# Patient Record
Sex: Female | Born: 1965 | Race: White | Hispanic: No | Marital: Married | Smoking: Never smoker
Health system: Southern US, Community
[De-identification: ages and names within clinical notes are randomized; demographics above are authoritative.]

## PROBLEM LIST (undated history)

## (undated) DIAGNOSIS — L309 Dermatitis, unspecified: Secondary | ICD-10-CM

## (undated) DIAGNOSIS — N200 Calculus of kidney: Secondary | ICD-10-CM

## (undated) DIAGNOSIS — F329 Major depressive disorder, single episode, unspecified: Secondary | ICD-10-CM

## (undated) DIAGNOSIS — Z8632 Personal history of gestational diabetes: Secondary | ICD-10-CM

## (undated) DIAGNOSIS — R351 Nocturia: Secondary | ICD-10-CM

## (undated) DIAGNOSIS — N1330 Unspecified hydronephrosis: Secondary | ICD-10-CM

## (undated) DIAGNOSIS — Z87442 Personal history of urinary calculi: Secondary | ICD-10-CM

## (undated) DIAGNOSIS — F32A Depression, unspecified: Secondary | ICD-10-CM

## (undated) DIAGNOSIS — D509 Iron deficiency anemia, unspecified: Secondary | ICD-10-CM

## (undated) DIAGNOSIS — N201 Calculus of ureter: Secondary | ICD-10-CM

## (undated) DIAGNOSIS — Z8759 Personal history of other complications of pregnancy, childbirth and the puerperium: Secondary | ICD-10-CM

## (undated) HISTORY — DX: Dermatitis, unspecified: L30.9

---

## 2000-01-30 ENCOUNTER — Other Ambulatory Visit: Admission: RE | Admit: 2000-01-30 | Discharge: 2000-01-30 | Payer: Self-pay | Admitting: Gynecology

## 2000-06-01 ENCOUNTER — Encounter: Admission: RE | Admit: 2000-06-01 | Discharge: 2000-08-30 | Payer: Self-pay | Admitting: Gynecology

## 2000-06-13 ENCOUNTER — Other Ambulatory Visit: Admission: RE | Admit: 2000-06-13 | Discharge: 2000-06-13 | Payer: Self-pay | Admitting: Gynecology

## 2000-08-08 ENCOUNTER — Inpatient Hospital Stay (HOSPITAL_COMMUNITY): Admission: AD | Admit: 2000-08-08 | Discharge: 2000-08-11 | Payer: Self-pay | Admitting: Internal Medicine

## 2000-09-17 ENCOUNTER — Other Ambulatory Visit: Admission: RE | Admit: 2000-09-17 | Discharge: 2000-09-17 | Payer: Self-pay | Admitting: *Deleted

## 2004-04-20 ENCOUNTER — Ambulatory Visit (HOSPITAL_COMMUNITY): Admission: RE | Admit: 2004-04-20 | Discharge: 2004-04-20 | Payer: Self-pay | Admitting: Pulmonary Disease

## 2004-09-25 ENCOUNTER — Emergency Department (HOSPITAL_COMMUNITY): Admission: EM | Admit: 2004-09-25 | Discharge: 2004-09-25 | Payer: Self-pay | Admitting: Emergency Medicine

## 2004-11-09 ENCOUNTER — Emergency Department (HOSPITAL_COMMUNITY): Admission: EM | Admit: 2004-11-09 | Discharge: 2004-11-09 | Payer: Self-pay | Admitting: Emergency Medicine

## 2006-06-18 ENCOUNTER — Ambulatory Visit (HOSPITAL_COMMUNITY): Admission: AD | Admit: 2006-06-18 | Discharge: 2006-06-18 | Payer: Self-pay | Admitting: Gynecology

## 2006-06-18 ENCOUNTER — Encounter (INDEPENDENT_AMBULATORY_CARE_PROVIDER_SITE_OTHER): Payer: Self-pay | Admitting: Specialist

## 2006-06-18 HISTORY — PX: LAPAROSCOPY FOR ECTOPIC PREGNANCY: SUR765

## 2006-07-19 ENCOUNTER — Other Ambulatory Visit: Admission: RE | Admit: 2006-07-19 | Discharge: 2006-07-19 | Payer: Self-pay | Admitting: Gynecology

## 2006-11-13 HISTORY — PX: ACOUSTIC NEUROMA RESECTION: SHX5713

## 2006-11-29 ENCOUNTER — Ambulatory Visit: Payer: Self-pay | Admitting: Pulmonary Disease

## 2006-12-02 ENCOUNTER — Encounter: Admission: RE | Admit: 2006-12-02 | Discharge: 2006-12-02 | Payer: Self-pay | Admitting: Otolaryngology

## 2007-09-26 ENCOUNTER — Other Ambulatory Visit: Admission: RE | Admit: 2007-09-26 | Discharge: 2007-09-26 | Payer: Self-pay | Admitting: Gynecology

## 2008-06-23 ENCOUNTER — Encounter: Payer: Self-pay | Admitting: Pulmonary Disease

## 2008-07-10 ENCOUNTER — Ambulatory Visit (HOSPITAL_COMMUNITY): Admission: RE | Admit: 2008-07-10 | Discharge: 2008-07-10 | Payer: Self-pay | Admitting: Gynecology

## 2008-08-08 ENCOUNTER — Ambulatory Visit: Payer: Self-pay | Admitting: Gynecology

## 2008-09-09 ENCOUNTER — Ambulatory Visit: Payer: Self-pay | Admitting: Gynecology

## 2008-10-11 ENCOUNTER — Ambulatory Visit: Payer: Self-pay | Admitting: Gynecology

## 2008-10-26 ENCOUNTER — Ambulatory Visit: Payer: Self-pay | Admitting: Gynecology

## 2008-10-30 ENCOUNTER — Ambulatory Visit: Payer: Self-pay | Admitting: Gynecology

## 2008-11-03 ENCOUNTER — Ambulatory Visit: Payer: Self-pay | Admitting: Gynecology

## 2008-11-11 ENCOUNTER — Ambulatory Visit: Payer: Self-pay | Admitting: Gynecology

## 2008-11-26 ENCOUNTER — Ambulatory Visit: Payer: Self-pay | Admitting: Gynecology

## 2009-06-28 ENCOUNTER — Inpatient Hospital Stay (HOSPITAL_COMMUNITY): Admission: AD | Admit: 2009-06-28 | Discharge: 2009-06-30 | Payer: Self-pay | Admitting: Obstetrics and Gynecology

## 2009-06-29 ENCOUNTER — Encounter (INDEPENDENT_AMBULATORY_CARE_PROVIDER_SITE_OTHER): Payer: Self-pay | Admitting: Obstetrics and Gynecology

## 2009-06-29 HISTORY — PX: OTHER SURGICAL HISTORY: SHX169

## 2009-09-14 ENCOUNTER — Encounter: Payer: Self-pay | Admitting: Gynecology

## 2009-09-14 ENCOUNTER — Ambulatory Visit: Payer: Self-pay | Admitting: Gynecology

## 2009-09-14 ENCOUNTER — Other Ambulatory Visit: Admission: RE | Admit: 2009-09-14 | Discharge: 2009-09-14 | Payer: Self-pay | Admitting: Gynecology

## 2009-10-13 ENCOUNTER — Encounter: Payer: Self-pay | Admitting: Pulmonary Disease

## 2010-11-10 ENCOUNTER — Other Ambulatory Visit
Admission: RE | Admit: 2010-11-10 | Discharge: 2010-11-10 | Payer: Self-pay | Source: Home / Self Care | Admitting: Gynecology

## 2010-11-10 ENCOUNTER — Ambulatory Visit
Admission: RE | Admit: 2010-11-10 | Discharge: 2010-11-10 | Payer: Self-pay | Source: Home / Self Care | Attending: Gynecology | Admitting: Gynecology

## 2011-02-18 LAB — CBC
Hemoglobin: 8.3 g/dL — ABNORMAL LOW (ref 12.0–15.0)
MCHC: 33.8 g/dL (ref 30.0–36.0)
Platelets: 263 10*3/uL (ref 150–400)
Platelets: 347 10*3/uL (ref 150–400)
RDW: 14 % (ref 11.5–15.5)

## 2011-03-28 NOTE — Op Note (Signed)
[name], Sarah Watson NO.:  [id number]   MEDICAL RECORD NO.:  [id number]         PATIENT TYPE:  WINP   LOCATION:                                FACILITY:  [facility]   PHYSICIAN:  Malva Limes, M.D.    DATE OF BIRTH:  11-03-1966   DATE OF PROCEDURE:  06/29/2009  DATE OF DISCHARGE:                               OPERATIVE REPORT   PREOPERATIVE DIAGNOSIS:  The patient desires permanent sterilization.   POSTOPERATIVE DIAGNOSIS:  The patient desires permanent sterilization.   PROCEDURE:  Left partial salpingectomy   SURGEON:  Malva Limes, MD   ANESTHESIA:  Epidural.   ANTIBIOTICS:  Ancef 1 g.   DRAINS:  Red rubber catheter to bladder.   ESTIMATED BLOOD LOSS:  Minimal.   SPECIMENS:  Portion of left fallopian tube sent to Pathology.   COMPLICATIONS:  None.   PROCEDURE:  The patient was taken to the operating room where her  epidural anesthetic was reinjected.  Once an adequate level was reached,  the patient was placed in dorsal supine position.  She was prepped and  draped in usual fashion for this procedure.  Her bladder was drained  with a red rubber catheter.  Umbilicus was injected with 0.25% Marcaine  tube. A 2-cm infraumbilical incision was made.  This incision was  carried down to the fascia.  The fascia was grasped with 2 Kochers and  entered with blade.  The parietal peritoneum was identified, grasped  with hemostats, and entered with Metzenbaum scissors.  The patient was  then placed in Trendelenburg.  Army-[employer]y retractors were placed.  The  omentum was moved anteriorly.  The left fallopian tube was then grasped  with a Babcock and carried to the fimbriated end for identification.  Once this was accomplished, 2-cm knuckle in the isthmic portion was  doubly ligated with 0 plain gut suture.  The knuckle was excised.  Both  ostia were visualized.  Hemostasis appeared to be adequate.  The tube  was released.  The right adnexa was inspected.  Ovary  appeared to be  normal.  There was no evidence of a tube on the right.  At this point,  the parietal peritoneum and rectus muscles  were reapproximated using 0 Monocryl suture in a running fashion.  The  fascia was closed using 3-0 Vicryl in a subcuticular fashion.  The  patient tolerated the procedure well.  She was taken to recovery room in  stable condition.  Instrument and lap counts were correct x2.           ______________________________  Malva Limes, M.D.     MA/MEDQ  D:  06/29/2009  T:  06/29/2009  Job:  [id number]

## 2011-03-31 NOTE — H&P (Signed)
NAMEKEERA, ALTIDOR                 ACCOUNT NO.:  [id number]   MEDICAL RECORD NO.:  [id number]          PATIENT TYPE:  AMB   LOCATION:                                [facility]ILITY:  [facility]   PHYSICIAN:  Juan H. Lily Peer, M.D.DATE OF BIRTH:  06/18/1985   DATE OF ADMISSION:  DATE OF DISCHARGE:                                HISTORY & PHYSICAL   CHIEF COMPLAINT:  Right ectopic pregnancy.   HISTORY:  The patient is a 45 year old gravida 2, para 1, who was seen in  the office on July 30th.  She had not been seen in the office for 5 years  prior to that.  She had not been sure of her last menstrual period.  She  stated it was some time in the month of June, which on that visit, July  30th, would have placed her 4-1/[redacted] weeks gestation.  She was asked to return  back to the office for an ultrasound for confirmation of viability as well  as for correct dates and the ultrasound today demonstrated a right viable  ectopic pregnancy, noted mass measuring 24 x 17 mm with fetal heart rate at  141 b.p.m., right ovary thin wall cystic mass measuring 23 x 18 mm, and a  thick wall mass 17 x 17 mm.  The ectopic was located superior to the right  ovary and the right adnexa adjacent to the lateral wall of the uterus, the  left ovary was normal.  The endometrial cavity thick, tri-layered, with no  intrauterine pregnancy seen.  The patient did not appear to be in any acute  distress, but on examination was tender in her right lower quadrant, no  unusual vaginal bleeding was reported.  The patient's last meal was a very  light snack at 11 a.m. today, approximately 6 hours ago.   PAST MEDICAL HISTORY:  1. THE PATIENT IS ALLERGIC TO PENICILLIN.  2. She has had one normal spontaneous vaginal delivery.  3. She is currently on prenatal vitamins.   PHYSICAL EXAMINATION:  Blood pressure 120/70.  Patient in no acute distress.  HEENT:  Unremarkable.  NECK:  Supple, trachea midline.  No carotid bruits, no thyromegaly.  LUNGS:  Clear to auscultation without rhonchi or wheezes.  HEART:  Regular rate and rhythm.  No murmurs or gallop.  BREAST EXAM:  Not done.  ABDOMEN:  Soft.  Tenderness in the right lower quadrant but no rebound or  guarding.  Negative Rovsing, negative obturator sign.  PELVIC EXAMINATION:  Was not done due to the fact that the ultrasound had  been done prior to the visit to the examining room, and based on these  findings we decided to proceed with laparoscopic evaluation.   ASSESSMENT:  A 45 year old gravida 1, para 1, with a right ectopic  pregnancy, cardiac activity noted, nonruptured.  Patient stable.  Will be  sent to [facility] for emergency laparoscopic attention to the right  ectopic pregnancy.  Patient was counseled of the possible scenarios such as  salpingostomy versus salpingectomy versus right salpingo-oophorectomy.  Also, in the event of technical difficulty  and unable to complete the  operation laparoscopically, we may need to proceed with an open laparotomy  technique which may require the patient to stay in the hospital additional  days.  The risks of infection, although she will receive prophylaxis  antibiotic, were discussed.  The risks of internal hemorrhage were discussed  with the patient and in the event of blood transfusion potential risk of  anaphylactic reactions, hepatitis and acquired immunodeficiency syndrome  were discussed as well.  All of these questions were answered and will  follow accordingly.  Also, the risks of deep vein thrombosis and  pulmonary embolism were discussed as well, as well as complications from  anesthesia will be discussed separately.  All questions were answered and  will follow accordingly.   PLAN:  As per assessment above.      Juan H. Lily Peer, M.D.  Electronically Signed     JHF/MEDQ  D:  06/18/2006  T:  06/18/2006  Job:  [id number]

## 2011-03-31 NOTE — H&P (Signed)
[facility]  Patient:    Sarah Watson, Sarah Watson                          MRN: [id number] Adm. Date:  08/08/00 Attending:  Gaetano Hawthorne. Lily Peer, M.D.                         History and Physical  CHIEF COMPLAINT: 1. Nonreassuring fetal heart rate tracing. 2. Gestational diabetes.  HISTORY OF PRESENT ILLNESS:  The patient is a 45 year old gravida 2, para 0, AB 1 with a date of confinement of August 19, 2000.  The patient currently 38-1/2 weeks estimated gestational age with known gestational diabetes, diet controlled, was seen in the office today for followup NST and the NST showed evidence of repetitive small late decelerations in the first half of the monitoring.  In the second half the fetal heart tracing was reassuring nevertheless the patient was instructed to have a biophysical profile whereby the biophysical profile demonstrated a score of 6/8, two were taken off for breathing movements.  If we add the entire composite score to include NST her biophysical profile would be 6/10.  There was a questionable nuchal cord, it is female infant in the vertex presentation, an incidental finding that was not seen on previous ultrasound scan and demonstrated a 6 x 4 mm echogenic focus just beneath the right atrium.  Difficult to analyze due to the fetal position and age.  Possibly beneath the diaphragm but the assessment was extremely limited.  The AFI was 13.9 at 50% for the [redacted] weeks gestation.  The patients prenatal course besides being followed for her blood sugars had an atypical squamous cells on Pap smear and also had to repeat her alpha fetoprotein secondary to discrepancy in the dates.  Followup AFP at the appropriate time was reported to be normal.  Also, patient had a low lying placenta early in the scan.  Followup ultrasounds demonstrated completed resolution of low lying placenta.  PAST MEDICAL HISTORY:  She had a D&C in 1986, at [redacted] weeks  gestation.  ALLERGIES: She is allergic to PENICILLIN.  REVIEW OF SYSTEMS:  See hospital form.  PHYSICAL EXAMINATION:  GENERAL:  Well-developed, well-nourished female.  HEENT:  Unremarkable.  NECK:  Supple. Trachea midline.  No carotid bruits, no thyromegaly.  LUNGS:  Clear to auscultation without rhonchis or wheezes.  HEART:  Regular rate and rhythm, no murmurs or gallops.  BREAST:  Examination was done during the first trimester and reported to be normal.  ABDOMEN:   Gravid uterus.  Vertex presentation by [facility] maneuver.  Confirmed by ultrasound today.  Positive fetal heart tones.  PELVIC EXAM:  Cervix finger tip 50% effaced, -3 station.  EXTREMITIES:  DTR 1+, negative clonus.  PRENATAL LABORATORY DATA:  Blood type B+, negative antibody screen, sickle cell trait was negative.  VDRL was nonreactive.  Rubella without evidence of immunity.  Hepatitis B surface antigen and HIV were negative. GC and chlamydia cultures were negative.  Alpha-fetoprotein as discussed above.  CBS culture was negative.  Blood sugar screen abnormal as was 3 hour GTT.  ASSESSMENT:  A 45 year old gravida 2, para 0, AB 1, at 38-1/2 weeks estimated gestational age with nonreassuring fetal heart rate tracing and a biophysical profile of 6/10.  Will be admitted for induction.  She will receive cervidil for cervical ripening and then follow with Pitocin IV in the event of a persistence of nonreassuring  fetal heart rate tracing.  The patient is aware that she could subsequently result in C-section as a mode of delivery.  All questions were answered and we will monitor closely.  PLAN:  As per assessment above. DD:  08/08/00 TD:  08/08/00 Job: [id number]

## 2011-03-31 NOTE — Op Note (Signed)
NAMECOPPER, KIRTLEY                 ACCOUNT NO.:  [id number]   MEDICAL RECORD NO.:  [id number]          PATIENT TYPE:  AMB   LOCATION:  [facility]                          FACILITY:  [facility]   PHYSICIAN:  Juan H. Lily Peer, M.D.DATE OF BIRTH:  03/30/1966   DATE OF PROCEDURE:  06/18/2006  DATE OF DISCHARGE:                                 OPERATIVE REPORT   INDICATIONS FOR OPERATION:  A 45 year old, gravida 2, para 1 with a right  ectopic pregnancy with evidence of cardiac activity on ultrasound.   PREOPERATIVE DIAGNOSIS:  Right ectopic pregnancy.   POSTOPERATIVE DIAGNOSIS:  Right ampullary isthmic ectopic pregnancy.   ANESTHESIA:  General endotracheal anesthesia.   SURGEON:  Juan H. Lily Peer, M.D.   PROCEDURE PERFORMED:  1. Diagnostic laparoscopy.  2. Right salpingectomy.   DESCRIPTION OF OPERATION:  After the patient was adequately counseled, she  was taken to the operating room where she underwent a successful general  endotracheal anesthesia.  A Foley catheter was then placed. The patient was  placed in the low lithotomy position.  The abdomen, vagina and perineum were  prepped and draped in the usual sterile fashion.  A red rubber Roxan Hockey had  been inserted to evacuate its contents for approximately 50 mL. The patient  had received 900 mg of clindamycin IV due to the fact that she has allergies  to PENICILLIN.  After the drapes were in place, a small stab incision was  made in the infraumbilical region followed by the Optiview 10 mm trocar  under laparoscopic guidance.  Once this was accomplished, the trocar was  removed and the scope was placed back into the pelvic cavity. Two additional  ports were made approximately 4 fingerbreadths in the midline allowing 5 mm  trocars to be inserted on each side of the midline under laparoscopic  guidance. Systematic inspection of the pelvic cavity demonstrated a normal  left tube and ovary, normal anterior posterior cul-de-sac, a right  ampullary  isthmic ectopic pregnancy pending rupture was noted.  Pitressin was  infiltrated into the antimesenteric portion of the tube (4 units of  Pitressin diluted in 200 mL of normal saline) for a total of 9 mL.  An  attempt was made to do a linear salpingostomy, but due to amount of bleeding  and the extent of the ampullary ectopic pregnancy and its size made it not  feasible to proceed with a salpingostomy and a right salpingectomy was  performed by using the tripolar gyrus instrument to excise the right tube  from the proximal junction of the uterus and serially clamp cauterizing and  cutting into the mesosalpinx to remove the entire tube. This was passed off  through the 10 mm port, passed off for histological evaluation.  The pelvic  cavity was then copiously irrigated with normal saline solution.  Pictures  pre and postprocedure were obtained.  The carbon dioxide that had previously  been placed in the abdominal cavity was removed.  The 10 mm port sites  required only one figure-of-eight suture in the fascia. In all three port  sites, the skin was reapproximated  with Dermabond glue and approximately 9  mL of 0.25% Marcaine was infiltrated in all three port sites totally for  postoperative analgesia.  The Hulka tenaculum that was previously placed  prior to the surgery was removed. The cervix was inspected and there was no  bleeding from the tenaculum.  A red rubber Roxan Hockey was  reinserted and approximately 50 mL were obtained.  The patient had received  Toradol 30 mg IV in the operating room. She was extubated and transferred to  the recovery room with stable vital signs.  Blood loss was less than 50 mL,  IV fluid was 1600 mL of lactated Ringer's.  Urine output was 50 mL. The  patient's blood type is B+.      Juan H. Lily Peer, M.D.  Electronically Signed     JHF/MEDQ  D:  06/18/2006  T:  06/19/2006  Job:  [id number]

## 2011-03-31 NOTE — Discharge Summary (Signed)
[facility]  Patient:    Sarah Watson, Sarah Watson                          MRN: [id number] Adm. Date:  [id number] Disc. Date: [id number] Attending:  Tonye Royalty Dictator:   Antony Contras, [facility]                           Discharge Summary  DISCHARGE DIAGNOSIS:          1. Intrauterine pregnancy at 39 and 1/2 weeks.                               2. History of diet-controlled gestational                                  diabetes.                               3. Nonreassuring nonstress test.                               4. Biophysical profile 6/10.  PROCEDURES:                   1. Induction of labor.                               2. Normal spontaneous vaginal delivery over an                                  intact perineum with second degree                                  laceration.  HISTORY OF PRESENT ILLNESS:   The patient is a 45 year old gravida 2, para 0-0-1-0 with LMP October 28, 1999, [facility] August 19, 2000, by ultrasound. Prenatal risk factors include gestational diabetes with current pregnancy which is diet controlled, rubella negative, history of ASCUS Pap smear.  PRENATAL LABORATORY DATA:     Blood type B positive, antibody screen negative, sickle cell negative, RPR, HBsAg, and HIV nonreactive, rubella nonimmune, AFP normal, and GBS negative.  HOSPITAL COURSE:              The patient was admitted on August 08, 2000, for induction of labor after a nonreassuring NST in the office which showed evidence of some repetitive small late decelerations in the first half of the strip, although the second half of the tracing was reassuring.  The patient was instructed to have a biophysical profile, and this demonstrated a score of 6/8.  Two were taken off for breathing movements.  If the NST score is added in, the total profile would be 6/10.  On admission cervix is a fingertip, 50% effacement, -3 station.  Cervidil was initiated followed by  high-dose Pitocin. The patient did progress to complete dilatation and was delivered of an Apgars 8 and 63 female infant over an intact perineum with a second degree laceration, weight 6 pounds  4 ounces.  Postpartum course:  The patient remained afebrile, had no difficulty voiding, and was able to be discharged in satisfactory condition on her second postpartum day.  Postpartum CBC:  Hematocrit 29.2, hemoglobin 9.7, WBC 12.6, platelets 314.  The patient did receive rubella vaccine prior to discharge.  DISPOSITION/FOLLOWUP:         Follow up in the office on six weeks.  DISCHARGE MEDICATIONS:        Prenatal vitamins, iron, Motrin, and Tylox for pain. DD:  08/31/00 TD:  09/01/00 Job: [id number]

## 2011-06-07 ENCOUNTER — Telehealth: Payer: Self-pay | Admitting: *Deleted

## 2011-06-08 NOTE — Telephone Encounter (Signed)
PT CALLED C/O UTI. PT NEEDS TO MAKE AN APPOINTMENT PT LAST OV WAS 11/10/10. LM ON VM TO MAKE OFFICE VISIT.

## 2011-06-09 ENCOUNTER — Encounter: Payer: Self-pay | Admitting: Gynecology

## 2011-06-09 ENCOUNTER — Ambulatory Visit (INDEPENDENT_AMBULATORY_CARE_PROVIDER_SITE_OTHER): Payer: BC Managed Care – PPO | Admitting: Gynecology

## 2011-06-09 ENCOUNTER — Encounter: Payer: Self-pay | Admitting: Anesthesiology

## 2011-06-09 VITALS — BP 124/82

## 2011-06-09 DIAGNOSIS — N390 Urinary tract infection, site not specified: Secondary | ICD-10-CM

## 2011-06-09 DIAGNOSIS — R300 Dysuria: Secondary | ICD-10-CM

## 2011-06-09 MED ORDER — URIBEL 118 MG PO CAPS: 1.0000 | ORAL_CAPSULE | Freq: Four times a day (QID) | ORAL | Status: DC

## 2011-06-09 MED ORDER — NITROFURANTOIN MONOHYD MACRO 100 MG PO CAPS: 100.0000 mg | ORAL_CAPSULE | Freq: Two times a day (BID) | ORAL | Status: AC

## 2011-06-09 NOTE — Progress Notes (Signed)
Addended byCammie Mcgee T on: 06/09/2011 12:33 PM   Modules accepted: Orders

## 2011-06-09 NOTE — Progress Notes (Signed)
Patient presented to the office today complaining for the past several days of dysuria, and frequency. Patient denied any fever chills nausea or vomiting. On examination patient had a soft abdomen no rebound or guarding and no CVA tenderness. Vaginal exam not done. Urinalysis: 10-15 WBC, 20+ RBC, 2+ bacteria Assessment: Urinary tract infection Plan: Macrobid 1 tablet twice a day for 7 days, uribel 1 tablet 4 times a day for 2 days. Urine submitted for culture. Patient to increase her fluid intake. If patient develops any fever chills nausea vomiting or flank pain she should report to the office during office hours or to the emergency room after hours.

## 2011-06-09 NOTE — Patient Instructions (Signed)
Please take a uribel sample tablet that I have provided for U one tablet 4 times a day for 2 days to alleviate some of the symptoms of burning during urination. A prescription for the Macrobid is at her pharmacy her to take one tablet twice a day for 7 days and to increase her fluid intake. If you develop any back pain fever chills nausea or vomiting he need to report to the office immediately or to the emergency room after hours.

## 2012-06-03 ENCOUNTER — Encounter: Payer: Self-pay | Admitting: Gynecology

## 2012-06-03 ENCOUNTER — Ambulatory Visit (INDEPENDENT_AMBULATORY_CARE_PROVIDER_SITE_OTHER): Payer: BC Managed Care – PPO | Admitting: Gynecology

## 2012-06-03 VITALS — BP 118/70 | Ht 63.5 in | Wt 162.0 lb

## 2012-06-03 DIAGNOSIS — Z01419 Encounter for gynecological examination (general) (routine) without abnormal findings: Secondary | ICD-10-CM

## 2012-06-03 DIAGNOSIS — R635 Abnormal weight gain: Secondary | ICD-10-CM

## 2012-06-03 DIAGNOSIS — Z8632 Personal history of gestational diabetes: Secondary | ICD-10-CM

## 2012-06-03 LAB — CBC WITH DIFFERENTIAL/PLATELET
Basophils Relative: 0 % (ref 0–1)
Eosinophils Absolute: 0 10*3/uL (ref 0.0–0.7)
Eosinophils Relative: 0 % (ref 0–5)
Hemoglobin: 12.8 g/dL (ref 12.0–15.0)
Lymphocytes Relative: 8 % — ABNORMAL LOW (ref 12–46)
MCH: 29.6 pg (ref 26.0–34.0)
MCV: 91.5 fL (ref 78.0–100.0)
Monocytes Relative: 5 % (ref 3–12)
Neutro Abs: 13.8 10*3/uL — ABNORMAL HIGH (ref 1.7–7.7)
Platelets: 372 10*3/uL (ref 150–400)
WBC: 15.9 10*3/uL — ABNORMAL HIGH (ref 4.0–10.5)

## 2012-06-03 LAB — HEMOGLOBIN A1C: Hgb A1c MFr Bld: 5 % (ref <5.7)

## 2012-06-03 LAB — CHOLESTEROL, TOTAL: Cholesterol: 190 mg/dL (ref 0–200)

## 2012-06-03 NOTE — Progress Notes (Signed)
Sarah Watson Nov 27, 1965 [id number]   History:    46 y.o.  for annual gyn exam with no complaints today. Review of her record indicated she's had a prior right salpingectomy as a result of an ectopic pregnancy and her contralateral tube has been transected (sterilized) and her cycles are reported to be regular. She has a history of gestational diabetes in the past. She been concerned about some weight gain since last year. Her last mammogram was in 2012 and she states she does her monthly self breast examination.  Past medical history,surgical history, family history and social history were all reviewed and documented in the EPIC chart.  Gynecologic History Patient's last menstrual period was 05/06/2012. Contraception: tubal ligation Last Pap: 2011. Results were: normal Last mammogram: 2012. Results were: normal  Obstetric History OB History    Grav Para Term Preterm Abortions TAB SAB Ect Mult Living   4 4 2       2      # Outc Date GA Lbr Len/2nd Wgt Sex Del Anes PTL Lv   1 TRM     M SVD  No Yes   2 TRM     F SVD  No Yes   3 PAR            4 PAR                ROS: A ROS was performed and pertinent positives and negatives are included in the history.  GENERAL: No fevers or chills. HEENT: No change in vision, no earache, sore throat or sinus congestion. NECK: No pain or stiffness. CARDIOVASCULAR: No chest pain or pressure. No palpitations. PULMONARY: No shortness of breath, cough or wheeze. GASTROINTESTINAL: No abdominal pain, nausea, vomiting or diarrhea, melena or bright red blood per rectum. GENITOURINARY: No urinary frequency, urgency, hesitancy or dysuria. MUSCULOSKELETAL: No joint or muscle pain, no back pain, no recent trauma. DERMATOLOGIC: No rash, no itching, no lesions. ENDOCRINE: No polyuria, polydipsia, no heat or cold intolerance. No recent change in weight. HEMATOLOGICAL: No anemia or easy bruising or bleeding. NEUROLOGIC: No headache, seizures, numbness, tingling or weakness.  PSYCHIATRIC: No depression, no loss of interest in normal activity or change in sleep pattern.     Exam: chaperone present  BP 118/70  Ht 5' 3.5" (1.613 m)  Wt 162 lb (73.483 kg)  BMI 28.25 kg/m2  LMP 05/06/2012  Body mass index is 28.25 kg/(m^2).  General appearance : Well developed well nourished female. No acute distress HEENT: Neck supple, trachea midline, no carotid bruits, no thyroidmegaly Lungs: Clear to auscultation, no rhonchi or wheezes, or rib retractions  Heart: Regular rate and rhythm, no murmurs or gallops Breast:Examined in sitting and supine position were symmetrical in appearance, no palpable masses or tenderness,  no skin retraction, no nipple inversion, no nipple discharge, no skin discoloration, no axillary or supraclavicular lymphadenopathy Abdomen: no palpable masses or tenderness, no rebound or guarding Extremities: no edema or skin discoloration or tenderness  Pelvic:  Bartholin, Urethra, Skene Glands: Within normal limits             Vagina: No gross lesions or discharge  Cervix: No gross lesions or discharge  Uterus  anteverted, normal size, shape and consistency, non-tender and mobile  Adnexa  Without masses or tenderness  Anus and perineum  normal   Rectovaginal  normal sphincter tone without palpated masses or tenderness             Hemoccult not done  Assessment/Plan:  46 y.o. female for annual exam with only complaint was of weight gain. Patient has had prior history gestational diabetes and since she is nonfasting a hemoglobin A1c will be done today. The following other labs will be drawn: CBC, screening cholesterol, urinalysis and TSH. We did discuss the new Pap smear screening guidelines and she will not need one  until next year. Requisition was provided for her to schedule her mammogram. We discussed importance of calcium vitamin D and regular exercise for osteoporosis prevention we'll see her back in one year or when  necessary.    Ok Edwards MD, 12:25 PM 06/03/2012

## 2012-06-03 NOTE — Patient Instructions (Addendum)
Health Maintenance, Females A healthy lifestyle and preventative care can promote health and wellness.  Maintain regular health, dental, and eye exams.   Eat a healthy diet. Foods like vegetables, fruits, whole grains, low-fat dairy products, and lean protein foods contain the nutrients you need without too many calories. Decrease your intake of foods high in solid fats, added sugars, and salt. Get information about a proper diet from your caregiver, if necessary.   Regular physical exercise is one of the most important things you can do for your health. Most adults should get at least 150 minutes of moderate-intensity exercise (any activity that increases your heart rate and causes you to sweat) each week. In addition, most adults need muscle-strengthening exercises on 2 or more days a week.    Maintain a healthy weight. The body mass index (BMI) is a screening tool to identify possible weight problems. It provides an estimate of body fat based on height and weight. Your caregiver can help determine your BMI, and can help you achieve or maintain a healthy weight. For adults 20 years and older:   A BMI below 18.5 is considered underweight.   A BMI of 18.5 to 24.9 is normal.   A BMI of 25 to 29.9 is considered overweight.   A BMI of 30 and above is considered obese.   Maintain normal blood lipids and cholesterol by exercising and minimizing your intake of saturated fat. Eat a balanced diet with plenty of fruits and vegetables. Blood tests for lipids and cholesterol should begin at age 20 and be repeated every 5 years. If your lipid or cholesterol levels are high, you are over 50, or you are a high risk for heart disease, you may need your cholesterol levels checked more frequently.Ongoing high lipid and cholesterol levels should be treated with medicines if diet and exercise are not effective.   If you smoke, find out from your caregiver how to quit. If you do not use tobacco, do not start.    If you are pregnant, do not drink alcohol. If you are breastfeeding, be very cautious about drinking alcohol. If you are not pregnant and choose to drink alcohol, do not exceed 1 drink per day. One drink is considered to be 12 ounces (355 mL) of beer, 5 ounces (148 mL) of wine, or 1.5 ounces (44 mL) of liquor.   Avoid use of street drugs. Do not share needles with anyone. Ask for help if you need support or instructions about stopping the use of drugs.   High blood pressure causes heart disease and increases the risk of stroke. Blood pressure should be checked at least every 1 to 2 years. Ongoing high blood pressure should be treated with medicines, if weight loss and exercise are not effective.   If you are 55 to 46 years old, ask your caregiver if you should take aspirin to prevent strokes.   Diabetes screening involves taking a blood sample to check your fasting blood sugar level. This should be done once every 3 years, after age 45, if you are within normal weight and without risk factors for diabetes. Testing should be considered at a younger age or be carried out more frequently if you are overweight and have at least 1 risk factor for diabetes.   Breast cancer screening is essential preventative care for women. You should practice "breast self-awareness." This means understanding the normal appearance and feel of your breasts and may include breast self-examination. Any changes detected, no matter how   small, should be reported to a caregiver. Women in their 20s and 30s should have a clinical breast exam (CBE) by a caregiver as part of a regular health exam every 1 to 3 years. After age 40, women should have a CBE every year. Starting at age 40, women should consider having a mammogram (breast X-ray) every year. Women who have a family history of breast cancer should talk to their caregiver about genetic screening. Women at a high risk of breast cancer should talk to their caregiver about having  an MRI and a mammogram every year.   The Pap test is a screening test for cervical cancer. Women should have a Pap test starting at age 21. Between ages 21 and 29, Pap tests should be repeated every 2 years. Beginning at age 30, you should have a Pap test every 3 years as long as the past 3 Pap tests have been normal. If you had a hysterectomy for a problem that was not cancer or a condition that could lead to cancer, then you no longer need Pap tests. If you are between ages 65 and 70, and you have had normal Pap tests going back 10 years, you no longer need Pap tests. If you have had past treatment for cervical cancer or a condition that could lead to cancer, you need Pap tests and screening for cancer for at least 20 years after your treatment. If Pap tests have been discontinued, risk factors (such as a new sexual partner) need to be reassessed to determine if screening should be resumed. Some women have medical problems that increase the chance of getting cervical cancer. In these cases, your caregiver may recommend more frequent screening and Pap tests.   The human papillomavirus (HPV) test is an additional test that may be used for cervical cancer screening. The HPV test looks for the virus that can cause the cell changes on the cervix. The cells collected during the Pap test can be tested for HPV. The HPV test could be used to screen women aged 30 years and older, and should be used in women of any age who have unclear Pap test results. After the age of 30, women should have HPV testing at the same frequency as a Pap test.   Colorectal cancer can be detected and often prevented. Most routine colorectal cancer screening begins at the age of 50 and continues through age 75. However, your caregiver may recommend screening at an earlier age if you have risk factors for colon cancer. On a yearly basis, your caregiver may provide home test kits to check for hidden blood in the stool. Use of a small camera at  the end of a tube, to directly examine the colon (sigmoidoscopy or colonoscopy), can detect the earliest forms of colorectal cancer. Talk to your caregiver about this at age 50, when routine screening begins. Direct examination of the colon should be repeated every 5 to 10 years through age 75, unless early forms of pre-cancerous polyps or small growths are found.   Hepatitis C blood testing is recommended for all people born from 1945 through 1965 and any individual with known risks for hepatitis C.   Practice safe sex. Use condoms and avoid high-risk sexual practices to reduce the spread of sexually transmitted infections (STIs). Sexually active women aged 25 and younger should be checked for Chlamydia, which is a common sexually transmitted infection. Older women with new or multiple partners should also be tested for Chlamydia. Testing for other   STIs is recommended if you are sexually active and at increased risk.   Osteoporosis is a disease in which the bones lose minerals and strength with aging. This can result in serious bone fractures. The risk of osteoporosis can be identified using a bone density scan. Women ages 74 and over and women at risk for fractures or osteoporosis should discuss screening with their caregivers. Ask your caregiver whether you should be taking a calcium supplement or vitamin D to reduce the rate of osteoporosis.   Menopause can be associated with physical symptoms and risks. Hormone replacement therapy is available to decrease symptoms and risks. You should talk to your caregiver about whether hormone replacement therapy is right for you.   Use sunscreen with a sun protection factor (SPF) of 30 or greater. Apply sunscreen liberally and repeatedly throughout the day. You should seek shade when your shadow is shorter than you. Protect yourself by wearing long sleeves, pants, a wide-brimmed hat, and sunglasses year round, whenever you are outdoors.   Notify your caregiver  of new moles or changes in moles, especially if there is a change in shape or color. Also notify your caregiver if a mole is larger than the size of a pencil eraser.   Stay current with your immunizations.  Document Released: 05/15/2011 Document Revised: 10/19/2011 Document Reviewed: 05/15/2011 [facility] Patient Information 2012 [place], [place].  Exercise to Lose Weight Exercise and a healthy diet may help you lose weight. Your doctor may suggest specific exercises. EXERCISE IDEAS AND TIPS  Choose low-cost things you enjoy doing, such as walking, bicycling, or exercising to workout videos.   Take stairs instead of the elevator.   Walk during your lunch break.   Park your car further away from work or school.   Go to a gym or an exercise class.   Start with 5 to 10 minutes of exercise each day. Build up to 30 minutes of exercise 4 to 6 days a week.   Wear shoes with good support and comfortable clothes.   Stretch before and after working out.   Work out until you breathe harder and your heart beats faster.   Drink extra water when you exercise.   Do not do so much that you hurt yourself, feel dizzy, or get very short of breath.  Exercises that burn about 150 calories:  Running 1  miles in 15 minutes.   Playing volleyball for 45 to 60 minutes.   Washing and waxing a car for 45 to 60 minutes.   Playing touch football for 45 minutes.   Walking 1  miles in 35 minutes.   Pushing a stroller 1  miles in 30 minutes.   Playing basketball for 30 minutes.   Raking leaves for 30 minutes.   Bicycling 5 miles in 30 minutes.   Walking 2 miles in 30 minutes.   Dancing for 30 minutes.   Shoveling snow for 15 minutes.   Swimming laps for 20 minutes.   Walking up stairs for 15 minutes.   Bicycling 4 miles in 15 minutes.   Gardening for 30 to 45 minutes.   Jumping rope for 15 minutes.   Washing windows or floors for 45 to 60 minutes.  Document Released: 12/02/2010  Document Revised: 07/12/2011 Document Reviewed: 12/02/2010 [facility] Patient Information 2012 [place], [place].

## 2012-06-04 LAB — URINALYSIS W MICROSCOPIC + REFLEX CULTURE
Bilirubin Urine: NEGATIVE
Ketones, ur: NEGATIVE mg/dL
Nitrite: NEGATIVE
Specific Gravity, Urine: 1.014 (ref 1.005–1.030)

## 2012-06-04 NOTE — Progress Notes (Signed)
That is correct 

## 2012-06-05 ENCOUNTER — Other Ambulatory Visit: Payer: Self-pay | Admitting: Gynecology

## 2012-06-05 DIAGNOSIS — R319 Hematuria, unspecified: Secondary | ICD-10-CM

## 2012-06-05 LAB — URINE CULTURE

## 2012-06-10 ENCOUNTER — Encounter: Payer: Self-pay | Admitting: Gynecology

## 2012-06-20 ENCOUNTER — Encounter: Payer: Self-pay | Admitting: Gynecology

## 2012-06-20 ENCOUNTER — Ambulatory Visit (INDEPENDENT_AMBULATORY_CARE_PROVIDER_SITE_OTHER): Payer: BC Managed Care – PPO | Admitting: Gynecology

## 2012-06-20 VITALS — BP 118/70

## 2012-06-20 DIAGNOSIS — R300 Dysuria: Secondary | ICD-10-CM

## 2012-06-20 DIAGNOSIS — N390 Urinary tract infection, site not specified: Secondary | ICD-10-CM

## 2012-06-20 DIAGNOSIS — R319 Hematuria, unspecified: Secondary | ICD-10-CM

## 2012-06-20 LAB — URINALYSIS W MICROSCOPIC + REFLEX CULTURE
Bilirubin Urine: NEGATIVE
Crystals: NONE SEEN
Ketones, ur: NEGATIVE mg/dL
Nitrite: NEGATIVE
Specific Gravity, Urine: 1.015 (ref 1.005–1.030)
Urobilinogen, UA: 0.2 mg/dL (ref 0.0–1.0)
pH: 7.5 (ref 5.0–8.0)

## 2012-06-20 MED ORDER — PHENAZOPYRIDINE HCL 200 MG PO TABS: 200.0000 mg | ORAL_TABLET | Freq: Three times a day (TID) | ORAL | Status: AC | PRN

## 2012-06-20 MED ORDER — NITROFURANTOIN MONOHYD MACRO 100 MG PO CAPS: 100.0000 mg | ORAL_CAPSULE | Freq: Two times a day (BID) | ORAL | Status: AC

## 2012-06-20 NOTE — Progress Notes (Signed)
Patient presented to the office today complaining of dysuria and frequency and noting blood in her urine. She stated a few days he or she had low-grade temperature. She denies any fever chills nausea or vomiting her back pain today. She was seen the office for her annual exam in July 22 and 2 days later she started her menstrual cycle. She was noted to have microscopic hematuria at that time and was to return to the office for repeat urinalysis been now she symptomatic  Exam: Abdomen: Soft nontender no rebound or guarding Pelvic: Bartholin urethra Skene was within normal limits Vagina: No lesions or discharge Uterus: Anteverted normal size shape and consistency Adnexa: No palpable masses or tenderness Rectal exam: Not done  Wet prep to numerous to count WBC, 3-6 RBC and many bacteria  Assessment/plan urinary tract infection will be started on Macrobid one by mouth twice a day for 7 day with the addition of Pyridium 200 mg one by mouth 3 times a day for 2 days. She is encouraged to increase her fluid intake. If she does not have any resolution of her symptoms in the next 72 hours or develops flank pain or fever she should report to the office or to the emergency room after hours.

## 2012-06-20 NOTE — Patient Instructions (Signed)

## 2012-06-22 LAB — URINE CULTURE: Colony Count: 25000

## 2012-06-24 ENCOUNTER — Other Ambulatory Visit: Payer: Self-pay | Admitting: Gynecology

## 2012-06-24 DIAGNOSIS — N390 Urinary tract infection, site not specified: Secondary | ICD-10-CM

## 2013-04-10 ENCOUNTER — Telehealth: Payer: Self-pay | Admitting: Pulmonary Disease

## 2013-04-10 NOTE — Telephone Encounter (Signed)
(  continued) new patients at this time.  Pt would like to hear from SN's lips himself that he will not see pt.  Pt states she brings her mother here every year for her appts & can not imagine that SN won't continue to see her as well.  Please advise.  Antionette Fairy

## 2013-04-10 NOTE — Telephone Encounter (Signed)
ATC NA and VM box is full WCB

## 2013-04-10 NOTE — Telephone Encounter (Signed)
Per SN---   Practice is closed due to winding down for retirement.  We will be glad to help her find a primary care doctor close to her home if she would like.

## 2013-04-10 NOTE — Telephone Encounter (Signed)
Please advise SN if you will accept pt again? thanks

## 2013-04-11 NOTE — Telephone Encounter (Signed)
Pt aware of recs per SN

## 2013-12-04 ENCOUNTER — Encounter: Payer: Self-pay | Admitting: Gynecology

## 2013-12-04 ENCOUNTER — Ambulatory Visit (INDEPENDENT_AMBULATORY_CARE_PROVIDER_SITE_OTHER): Payer: BC Managed Care – PPO | Admitting: Gynecology

## 2013-12-04 ENCOUNTER — Other Ambulatory Visit (HOSPITAL_COMMUNITY)
Admission: RE | Admit: 2013-12-04 | Discharge: 2013-12-04 | Disposition: A | Discharge status: Home / Self Care | Payer: BC Managed Care – PPO | Source: Ambulatory Visit | Attending: Gynecology | Admitting: Gynecology

## 2013-12-04 VITALS — BP 118/76 | Ht 64.0 in | Wt 134.4 lb

## 2013-12-04 DIAGNOSIS — N949 Unspecified condition associated with female genital organs and menstrual cycle: Secondary | ICD-10-CM

## 2013-12-04 DIAGNOSIS — Z1151 Encounter for screening for human papillomavirus (HPV): Secondary | ICD-10-CM | POA: Insufficient documentation

## 2013-12-04 DIAGNOSIS — R634 Abnormal weight loss: Secondary | ICD-10-CM

## 2013-12-04 DIAGNOSIS — Z113 Encounter for screening for infections with a predominantly sexual mode of transmission: Secondary | ICD-10-CM

## 2013-12-04 DIAGNOSIS — N898 Other specified noninflammatory disorders of vagina: Secondary | ICD-10-CM

## 2013-12-04 DIAGNOSIS — N951 Menopausal and female climacteric states: Secondary | ICD-10-CM

## 2013-12-04 DIAGNOSIS — Z01419 Encounter for gynecological examination (general) (routine) without abnormal findings: Secondary | ICD-10-CM | POA: Insufficient documentation

## 2013-12-04 LAB — TSH: TSH: 1.215 u[IU]/mL (ref 0.350–4.500)

## 2013-12-04 LAB — FOLLICLE STIMULATING HORMONE: FSH: 35.1 m[IU]/mL

## 2013-12-04 LAB — HIV ANTIBODY (ROUTINE TESTING W REFLEX): HIV: NONREACTIVE

## 2013-12-04 MED ORDER — METRONIDAZOLE 0.75 % VA GEL: 1.0000 | Freq: Two times a day (BID) | VAGINAL | Status: DC

## 2013-12-04 NOTE — Progress Notes (Signed)
Sarah Watson [birth year] [id number]   History:    48 y.o.  for annual gyn exam complaining of slight vaginal odor. The patient had gone to see her PCP and had her blood work done several months ago. Patient states that time she's got 2-3 months without her menstrual cycle. She has had a prior right salpingectomy as a result of ectopic pregnancy and her contralateral tube had been transected as part of sterilization. She does have a history of gestational diabetes in the past. She had seen her PCP because of decreased appetite. She has also been complaining of some irritability and hot flashes on and off at different times. Patient has a steady partner. Patient does not recall ever having been tested for HIV. Patient denies any past history of abnormal Pap smears.  Past medical history,surgical history, family history and social history were all reviewed and documented in the EPIC chart.  Gynecologic History Patient's last menstrual period was 11/20/2013. Contraception: tubal ligation Last Pap: 2011. Results were:normal} Last mammogram: 2013. Results were: normal  Obstetric History OB History  Gravida Para Term Preterm AB SAB TAB Ectopic Multiple Living  4 4 2       2     # Outcome Date GA Lbr Len/2nd Weight Sex Delivery Anes PTL Lv  4 PAR           3 PAR           2 TRM     F SVD  N Y  1 TRM     M SVD  N Y       ROS: A ROS was performed and pertinent positives and negatives are included in the history.  GENERAL: No fevers or chills. HEENT: No change in vision, no earache, sore throat or sinus congestion. NECK: No pain or stiffness. CARDIOVASCULAR: No chest pain or pressure. No palpitations. PULMONARY: No shortness of breath, cough or wheeze. GASTROINTESTINAL: No abdominal pain, nausea, vomiting or diarrhea, melena or bright red blood per rectum. GENITOURINARY: No urinary frequency, urgency, hesitancy or dysuria. MUSCULOSKELETAL: No joint or muscle pain, no back pain, no recent trauma.  DERMATOLOGIC: No rash, no itching, no lesions. ENDOCRINE: No polyuria, polydipsia, no heat or cold intolerance. No recent change in weight. HEMATOLOGICAL: No anemia or easy bruising or bleeding. NEUROLOGIC: No headache, seizures, numbness, tingling or weakness. PSYCHIATRIC: No depression, no loss of interest in normal activity or change in sleep pattern.     Exam: chaperone present  BP 118/76  Ht 5\' 4"  (1.626 m)  Wt 134 lb 6.4 oz (60.963 kg)  BMI 23.06 kg/m2  LMP 11/20/2013  Body mass index is 23.06 kg/(m^2).  General appearance : Well developed well nourished female. No acute distress HEENT: Neck supple, trachea midline, no carotid bruits, no thyroidmegaly Lungs: Clear to auscultation, no rhonchi or wheezes, or rib retractions  Heart: Regular rate and rhythm, no murmurs or gallops Breast:Examined in sitting and supine position were symmetrical in appearance, no palpable masses or tenderness,  no skin retraction, no nipple inversion, no nipple discharge, no skin discoloration, no axillary or supraclavicular lymphadenopathy Abdomen: no palpable masses or tenderness, no rebound or guarding Extremities: no edema or skin discoloration or tenderness  Pelvic:  Bartholin, Urethra, Skene Glands: Within normal limits             Vagina: No gross lesions or discharge  Cervix: No gross lesions or discharge  Uterus  anteverted, normal size, shape and consistency, non-tender and mobile  Adnexa  Without masses or tenderness  Anus and perineum  normal   Rectovaginal  normal sphincter tone without palpated masses or tenderness             Hemoccult not indicated     Assessment/Plan:  48 y.o. female for annual exam with peri-menopausal-like symptoms. Were going to check an [facility] today along with a TSH. We're also going to do an HIV because of concerns or weight loss and change in appetite. No additional blood work will be drawn since her PCP drew a few months ago. She will return back in 2 weeks to  discuss these results and I have asked her to get a copy of her previous lab work so that we can plan on managing her oligomenorrhea if she is not menopausal yet. Pap smear was done today.  Note: This dictation was prepared with  Dragon/digital dictation along withSmart phrase technology. Any transcriptional errors that result from this process are unintentional.   Terrance Mass MD, 1:32 PM 12/04/2013

## 2013-12-04 NOTE — Patient Instructions (Addendum)
Perimenopause Perimenopause is the time when your body begins to move into the menopause (no menstrual period for 12 straight months). It is a natural process. Perimenopause can begin 2 8 years before the menopause and usually lasts for 1 year after the menopause. During this time, your ovaries may or may not produce an egg. The ovaries vary in their production of estrogen and progesterone hormones each month. This can cause irregular menstrual periods, difficulty getting pregnant, vaginal bleeding between periods, and uncomfortable symptoms. CAUSES  Irregular production of the ovarian hormones, estrogen and progesterone, and not ovulating every month.  Other causes include:  Tumor of the pituitary gland in the brain.  Medical disease that affects the ovaries.  Radiation treatment.  Chemotherapy.  Unknown causes.  Heavy smoking and excessive alcohol intake can bring on perimenopause sooner. SIGNS AND SYMPTOMS   Hot flashes.  Night sweats.  Irregular menstrual periods.  Decreased sex drive.  Vaginal dryness.  Headaches.  Mood swings.  Depression.  Memory problems.  Irritability.  Tiredness.  Weight gain.  Trouble getting pregnant.  The beginning of losing bone cells (osteoporosis).  The beginning of hardening of the arteries (atherosclerosis). DIAGNOSIS  Your health care provider will make a diagnosis by analyzing your age, menstrual history, and symptoms. He or she will do a physical exam and note any changes in your body, especially your female organs. Female hormone tests may or may not be helpful depending on the amount of female hormones you produce and when you produce them. However, other hormone tests may be helpful to rule out other problems. TREATMENT  In some cases, no treatment is needed. The decision on whether treatment is necessary during the perimenopause should be made by you and your health care provider based on how the symptoms are affecting you  and your lifestyle. Various treatments are available, such as:  Treating individual symptoms with a specific medicine for that symptom.  Herbal medicines that can help specific symptoms.  Counseling.  Group therapy. HOME CARE INSTRUCTIONS   Keep track of your menstrual periods (when they occur, how heavy they are, how long between periods, and how long they last) as well as your symptoms and when they started.  Only take over-the-counter or prescription medicines as directed by your health care provider.  Sleep and rest.  Exercise.  Eat a diet that contains calcium (good for your bones) and soy (acts like the estrogen hormone).  Do not smoke.  Avoid alcoholic beverages.  Take vitamin supplements as recommended by your health care provider. Taking vitamin E may help in certain cases.  Take calcium and vitamin D supplements to help prevent bone loss.  Group therapy is sometimes helpful.  Acupuncture may help in some cases. SEEK MEDICAL CARE IF:   You have questions about any symptoms you are having.  You need a referral to a specialist (gynecologist, psychiatrist, or psychologist). SEEK IMMEDIATE MEDICAL CARE IF:   You have vaginal bleeding.  Your period lasts longer than 8 days.  Your periods are recurring sooner than 21 days.  You have bleeding after intercourse.  You have severe depression.  You have pain when you urinate.  You have severe headaches.  You have vision problems. Document Released: 12/07/2004 Document Revised: 08/20/2013 Document Reviewed: 05/29/2013 [facility] Patient Information 2014 [place], [place]. Hormone Therapy At menopause, your body begins making less estrogen and progesterone hormones. This causes the body to stop having menstrual periods. This is because estrogen and progesterone hormones control your periods and  menstrual cycle. A lack of estrogen may cause symptoms such as: Hot flushes (or hot flashes). Vaginal dryness. Dry  skin. Loss of sex drive. Risk of bone loss (osteoporosis). When this happens, you may choose to take hormone therapy to get back the estrogen lost during menopause. When the hormone estrogen is given alone, it is usually referred to as ET (Estrogen Therapy). When the hormone progestin is combined with estrogen, it is generally called HT (Hormone Therapy). This was formerly known as hormone replacement therapy (HRT). Your caregiver can help you make a decision on what will be best for you. The decision to use HT seems to change often as new studies are done. Many studies do not agree on the benefits of hormone replacement therapy. LIKELY BENEFITS OF HT INCLUDE PROTECTION FROM: Hot Flushes (also called hot flashes) - A hot flush is a sudden feeling of heat that spreads over the face and body. The skin may redden like a blush. It is connected with sweats and sleep disturbance. Women going through menopause may have hot flushes a few times a month or several times per day depending on the woman. Osteoporosis (bone loss)- Estrogen helps guard against bone loss. After menopause, a woman's bones slowly lose calcium and become weak and brittle. As a result, bones are more likely to break. The hip, wrist, and spine are affected most often. Hormone therapy can help slow bone loss after menopause. Weight bearing exercise and taking calcium with vitamin D also can help prevent bone loss. There are also medications that your caregiver can prescribe that can help prevent osteoporosis. Vaginal Dryness - Loss of estrogen causes changes in the vagina. Its lining may become thin and dry. These changes can cause pain and bleeding during sexual intercourse. Dryness can also lead to infections. This can cause burning and itching. (Vaginal estrogen treatment can help relieve pain, itching, and dryness.) Urinary Tract Infections are more common after menopause because of lack of estrogen. Some women also develop urinary  incontinence because of low estrogen levels in the vagina and bladder. Possible other benefits of estrogen include a positive effect on mood and short-term memory in women. RISKS AND COMPLICATIONS Using estrogen alone without progesterone causes the lining of the uterus to grow. This increases the risk of lining of the uterus (endometrial) cancer. Your caregiver should give another hormone called progestin if you have a uterus. Women who take combined (estrogen and progestin) HT appear to have an increased risk of breast cancer. The risk appears to be small, but increases throughout the time that HT is taken. Combined therapy also makes the breast tissue slightly denser which makes it harder to read mammograms (breast X-rays). Combined, estrogen and progesterone therapy can be taken together every day, in which case there may be spotting of blood. HT therapy can be taken cyclically in which case you will have menstrual periods. Cyclically means HT is taken for a set amount of days, then not taken, then this process is repeated. HT may increase the risk of stroke, heart attack, breast cancer and forming blood clots in your leg. Transdermal estrogen (estrogen that is absorbed through the skin with a patch or a cream) may have more positive results with: Cholesterol. Blood pressure. Blood clots. Having the following conditions may indicate you should not have HT: Endometrial cancer. Liver disease. Breast cancer. Heart disease. History of blood clots. Stroke. TREATMENT  If you choose to take HT and have a uterus, usually estrogen and progestin are prescribed. Your  caregiver will help you decide the best way to take the medications. Possible ways to take estrogen include: Pills. Patches. Gels. Sprays. Vaginal estrogen cream, rings and tablets. It is best to take the lowest dose possible that will help your symptoms and take them for the shortest period of time that you can. Hormone therapy  can help relieve some of the problems (symptoms) that affect women at menopause. Before making a decision about HT, talk to your caregiver about what is best for you. Be well informed and comfortable with your decisions. HOME CARE INSTRUCTIONS  Follow your caregivers advice when taking the medications. A Pap test is done to screen for cervical cancer. The first Pap test should be done at age 35. Between ages 51 and 35, Pap tests are repeated every 2 years. Beginning at age 56, you are advised to have a Pap test every 3 years as long as your past 3 Pap tests have been normal. Some women have medical problems that increase the chance of getting cervical cancer. Talk to your caregiver about these problems. It is especially important to talk to your caregiver if a new problem develops soon after your last Pap test. In these cases, your caregiver may recommend more frequent screening and Pap tests. The above recommendations are the same for women who have or have not gotten the vaccine for HPV (Human Papillomavirus). If you had a hysterectomy for a problem that was not a cancer or a condition that could lead to cancer, then you no longer need Pap tests. However, even if you no longer need a Pap test, a regular exam is a good idea to make sure no other problems are starting.  If you are between ages 54 and 52, and you have had normal Pap tests going back 10 years, you no longer need Pap tests. However, even if you no longer need a Pap test, a regular exam is a good idea to make sure no other problems are starting.  If you have had past treatment for cervical cancer or a condition that could lead to cancer, you need Pap tests and screening for cancer for at least 20 years after your treatment. If Pap tests have been discontinued, risk factors (such as a new sexual partner) need to be re-assessed to determine if screening should be resumed. Some women may need screenings more often if they are at high  risk for cervical cancer. Get mammograms done as per the advice of your caregiver. SEEK IMMEDIATE MEDICAL CARE IF: You develop abnormal vaginal bleeding. You have pain or swelling in your legs, shortness of breath, or chest pain. You develop dizziness or headaches. You have lumps or changes in your breasts or armpits. You have slurred speech. You develop weakness or numbness of your arms or legs. You have pain, burning, or bleeding when urinating. You develop abdominal pain. Document Released: 07/29/2003 Document Revised: 01/22/2012 Document Reviewed: 11/16/2010 [facility] Patient Information 2014 [place], [place].

## 2013-12-19 ENCOUNTER — Ambulatory Visit: Payer: BC Managed Care – PPO | Admitting: Gynecology

## 2014-03-08 DIAGNOSIS — D649 Anemia, unspecified: Secondary | ICD-10-CM | POA: Insufficient documentation

## 2014-03-09 ENCOUNTER — Encounter: Payer: Self-pay | Admitting: Internal Medicine

## 2014-03-24 ENCOUNTER — Encounter (HOSPITAL_COMMUNITY): Payer: BC Managed Care – PPO | Attending: Hematology and Oncology

## 2014-03-24 ENCOUNTER — Ambulatory Visit (HOSPITAL_COMMUNITY): Payer: BC Managed Care – PPO

## 2014-03-24 ENCOUNTER — Encounter (HOSPITAL_COMMUNITY): Payer: Self-pay

## 2014-03-24 ENCOUNTER — Encounter (HOSPITAL_BASED_OUTPATIENT_CLINIC_OR_DEPARTMENT_OTHER): Payer: BC Managed Care – PPO

## 2014-03-24 VITALS — BP 113/75 | HR 99 | Temp 98.6°F | Resp 18 | Ht 63.75 in | Wt 129.0 lb

## 2014-03-24 DIAGNOSIS — D649 Anemia, unspecified: Secondary | ICD-10-CM

## 2014-03-24 DIAGNOSIS — N915 Oligomenorrhea, unspecified: Secondary | ICD-10-CM | POA: Insufficient documentation

## 2014-03-24 DIAGNOSIS — E119 Type 2 diabetes mellitus without complications: Secondary | ICD-10-CM | POA: Insufficient documentation

## 2014-03-24 DIAGNOSIS — D75839 Thrombocytosis, unspecified: Secondary | ICD-10-CM

## 2014-03-24 DIAGNOSIS — F411 Generalized anxiety disorder: Secondary | ICD-10-CM | POA: Insufficient documentation

## 2014-03-24 DIAGNOSIS — L259 Unspecified contact dermatitis, unspecified cause: Secondary | ICD-10-CM | POA: Insufficient documentation

## 2014-03-24 DIAGNOSIS — D509 Iron deficiency anemia, unspecified: Secondary | ICD-10-CM | POA: Insufficient documentation

## 2014-03-24 DIAGNOSIS — D473 Essential (hemorrhagic) thrombocythemia: Secondary | ICD-10-CM

## 2014-03-24 LAB — COMPREHENSIVE METABOLIC PANEL
ALK PHOS: 82 U/L (ref 39–117)
ALT: 10 U/L (ref 0–35)
AST: 17 U/L (ref 0–37)
Albumin: 3.2 g/dL — ABNORMAL LOW (ref 3.5–5.2)
BUN: 18 mg/dL (ref 6–23)
CO2: 27 meq/L (ref 19–32)
Calcium: 9.2 mg/dL (ref 8.4–10.5)
Chloride: 99 mEq/L (ref 96–112)
Creatinine, Ser: 1.07 mg/dL (ref 0.50–1.10)
GFR, EST AFRICAN AMERICAN: 70 mL/min — AB (ref >90)
GFR, EST NON AFRICAN AMERICAN: 61 mL/min — AB (ref >90)
GLUCOSE: 93 mg/dL (ref 70–99)
POTASSIUM: 4.5 meq/L (ref 3.7–5.3)
SODIUM: 136 meq/L — AB (ref 137–147)
Total Bilirubin: 0.2 mg/dL — ABNORMAL LOW (ref 0.3–1.2)
Total Protein: 9.2 g/dL — ABNORMAL HIGH (ref 6.0–8.3)

## 2014-03-24 LAB — CBC WITH DIFFERENTIAL/PLATELET
Basophils Absolute: 0 10*3/uL (ref 0.0–0.1)
Basophils Relative: 1 % (ref 0–1)
Eosinophils Absolute: 0.1 10*3/uL (ref 0.0–0.7)
Eosinophils Relative: 1 % (ref 0–5)
HCT: 27.8 % — ABNORMAL LOW (ref 36.0–46.0)
Hemoglobin: 8.7 g/dL — ABNORMAL LOW (ref 12.0–15.0)
LYMPHS ABS: 1.5 10*3/uL (ref 0.7–4.0)
LYMPHS PCT: 17 % (ref 12–46)
MCH: 24.7 pg — ABNORMAL LOW (ref 26.0–34.0)
MCHC: 31.3 g/dL (ref 30.0–36.0)
MCV: 79 fL (ref 78.0–100.0)
Monocytes Absolute: 0.6 10*3/uL (ref 0.1–1.0)
Monocytes Relative: 7 % (ref 3–12)
NEUTROS PCT: 74 % (ref 43–77)
Neutro Abs: 6.4 10*3/uL (ref 1.7–7.7)
PLATELETS: 691 10*3/uL — AB (ref 150–400)
RBC: 3.52 MIL/uL — AB (ref 3.87–5.11)
RDW: 17.4 % — ABNORMAL HIGH (ref 11.5–15.5)
WBC: 8.6 10*3/uL (ref 4.0–10.5)

## 2014-03-24 LAB — RETICULOCYTES
RBC.: 3.52 MIL/uL — ABNORMAL LOW (ref 3.87–5.11)
Retic Count, Absolute: 42.2 10*3/uL (ref 19.0–186.0)
Retic Ct Pct: 1.2 % (ref 0.4–3.1)

## 2014-03-24 LAB — LACTATE DEHYDROGENASE: LDH: 189 U/L (ref 94–250)

## 2014-03-24 NOTE — Addendum Note (Signed)
Addended by: Mellissa Kohut on: 03/24/2014 04:14 PM   Modules accepted: Orders

## 2014-03-24 NOTE — Patient Instructions (Addendum)
[facility] Discharge Instructions  RECOMMENDATIONS MADE BY THE CONSULTANT AND ANY TEST RESULTS WILL BE SENT TO YOUR REFERRING PHYSICIAN.  EXAM FINDINGS BY THE PHYSICIAN TODAY AND SIGNS OR SYMPTOMS TO REPORT TO CLINIC OR PRIMARY PHYSICIAN: Exam and findings as discussed by Dr. Barnet Glasgow.  Will plan to give you a feraheme infusion on Friday.  Will check labs today and if there is anything of concern we will contact you.  MEDICATIONS PRESCRIBED:  none  INSTRUCTIONS/FOLLOW-UP: Follow-up with feraheme infusion on Friday and will see you back in 4 weeks with blood work and office visit  Thank you for choosing [facility] [place] to provide your oncology and hematology care.  To afford each patient quality time with our providers, please arrive at least 15 minutes before your scheduled appointment time.  With your help, our goal is to use those 15 minutes to complete the necessary work-up to ensure our physicians have the information they need to help with your evaluation and healthcare recommendations.    Effective January 1st, 2014, we ask that you re-schedule your appointment with our physicians should you arrive 10 or more minutes late for your appointment.  We strive to give you quality time with our providers, and arriving late affects you and other patients whose appointments are after yours.    Again, thank you for choosing [facility].  Our hope is that these requests will decrease the amount of time that you wait before being seen by our physicians.       _____________________________________________________________  Should you have questions after your visit to [facility], please contact our office at [phone] between the hours of 8:30 a.m. and 5:00 p.m.  Voicemails left after 4:30 p.m. will not be returned until the following business day.  For prescription refill requests, have your pharmacy contact our office with your  prescription refill request.     Iron Deficiency Anemia, Adult Anemia is a condition in which there are less red blood cells or hemoglobin in the blood than normal. Hemoglobin is this part of red blood cells that carries oxygen. Iron deficiency anemia is anemia caused by too little iron. It is the most common type of anemia. It may leave you tired and short of breath. CAUSES   Lack of iron in the diet.  Poor absorption of iron, as seen with intestinal disorders.  Intestinal bleeding.  Heavy periods. SIGNS AND SYMPTOMS  Mild anemia may not be noticeable. Symptoms may include:  Fatigue.  Headache.  Pale skin.  Weakness.  Tiredness.  Shortness of breath.  Dizziness.  Cold hands and feet.  Fast or irregular heartbeat. DIAGNOSIS  Diagnosis requires a thorough evaluation and physical exam by your health care provider. Blood tests are generally used to confirm iron deficiency anemia. Additional tests may be done to find the underlying cause of your anemia. These may include:  Testing for blood in the stool (fecal occult blood test).  A procedure to see inside the colon and rectum (colonoscopy).  A procedure to see inside the esophagus and stomach (endoscopy). TREATMENT  Iron deficiency anemia is treated by correcting the cause of the deficiency. Treatment may involve:  Adding iron-rich foods to your diet.  Taking iron supplements. Pregnant or breastfeeding women need to take extra iron, because their normal diet usually does not provide the required amount.  Taking vitamins. Vitamin C improves the absorption of iron. Your health care provider may recommend taking your iron  tablets with a glass of orange juice or vitamin C supplement.  Medicines to make heavy menstrual flow lighter.  Surgery. HOME CARE INSTRUCTIONS   Take iron as directed by your health care provider.  If you cannot tolerate taking iron supplements by mouth, talk to your health care provider about  taking them through a vein (intravenously) or an injection into a muscle.  For the best iron absorption, iron supplements should be taken on an empty stomach. If you cannot tolerate them on an empty stomach, you may need to take them with food.  Do not drink milk or take antacids at the same time as your iron supplements. Milk and antacids may interfere with the absorption of iron.  Iron supplements can cause constipation. Make sure to include fiber in your diet to prevent constipation. A stool softener may also be recommended.  Take vitamins as directed by your health care provider.  Eat a diet rich in iron. Foods high in iron include liver, lean beef, whole-grain bread, eggs, dried fruit, and dark green, leafy vegetables. SEEK IMMEDIATE MEDICAL CARE IF:   You faint. If this happens, do not drive. Call your local emergency services (911 in [place] if no other help is available.  You have chest pain.  You feel nauseous or vomit.  You have severe or increased shortness of breath with activity.  You feel weak.  You have a rapid heartbeat.  You have unexplained sweating.  You become lightheaded when getting up from a chair or bed. MAKE SURE YOU:   Understand these instructions.  Will watch your condition.  Will get help right away if you are not doing well or get worse. Document Released: 10/27/2000 Document Revised: 08/20/2013 Document Reviewed: 07/07/2013 [facility] Patient Information 2014 [place]. Ferumoxytol injection What is this medicine? FERUMOXYTOL is an iron complex. Iron is used to make healthy red blood cells, which carry oxygen and nutrients throughout the body. This medicine is used to treat iron deficiency anemia in people with chronic kidney disease. This medicine may be used for other purposes; ask your health care provider or pharmacist if you have questions. COMMON BRAND NAME(S): Feraheme  What should I tell my health care provider before I take this  medicine? They need to know if you have any of these conditions: -anemia not caused by low iron levels -high levels of iron in the blood -magnetic resonance imaging (MRI) test scheduled -an unusual or allergic reaction to iron, other medicines, foods, dyes, or preservatives -pregnant or trying to get pregnant -breast-feeding How should I use this medicine? This medicine is for injection into a vein. It is given by a health care professional in a hospital or clinic setting. Talk to your pediatrician regarding the use of this medicine in children. Special care may be needed. Overdosage: If you think you've taken too much of this medicine contact a poison control center or emergency room at once. Overdosage: If you think you have taken too much of this medicine contact a poison control center or emergency room at once. NOTE: This medicine is only for you. Do not share this medicine with others. What if I miss a dose? It is important not to miss your dose. Call your doctor or health care professional if you are unable to keep an appointment. What may interact with this medicine? This medicine may interact with the following medications: -other iron products This list may not describe all possible interactions. Give your health care provider a list of all  the medicines, herbs, non-prescription drugs, or dietary supplements you use. Also tell them if you smoke, drink alcohol, or use illegal drugs. Some items may interact with your medicine. What should I watch for while using this medicine? Visit your doctor or healthcare professional regularly. Tell your doctor or healthcare professional if your symptoms do not start to get better or if they get worse. You may need blood work done while you are taking this medicine. You may need to follow a special diet. Talk to your doctor. Foods that contain iron include: whole grains/cereals, dried fruits, beans, or peas, leafy green vegetables, and organ meats  (liver, kidney). What side effects may I notice from receiving this medicine? Side effects that you should report to your doctor or health care professional as soon as possible: -allergic reactions like skin rash, itching or hives, swelling of the face, lips, or tongue -breathing problems -changes in blood pressure -feeling faint or lightheaded, falls -fever or chills -flushing, sweating, or hot feelings -swelling of the ankles or feet Side effects that usually do not require medical attention (Report these to your doctor or health care professional if they continue or are bothersome.): -diarrhea -headache -nausea, vomiting -stomach pain This list may not describe all possible side effects. Call your doctor for medical advice about side effects. You may report side effects to FDA at 1-800-FDA-1088. Where should I keep my medicine? This drug is given in a hospital or clinic and will not be stored at home. NOTE: This sheet is a summary. It may not cover all possible information. If you have questions about this medicine, talk to your doctor, pharmacist, or health care provider.  2014, Elsevier/Gold Standard. (2012-06-14 15:23:36)

## 2014-03-24 NOTE — Progress Notes (Signed)
[place] A. Barnet Glasgow, M.D.  NEW PATIENT EVALUATION   Name: Sarah Watson Date: 03/24/2014 MRN: [id number] DOB: 1966-08-10  PCP: Noralee Space, MD   REFERRING PHYSICIAN: Noralee Space, MD  REASON FOR REFERRAL: Anemia     HISTORY OF PRESENT ILLNESS:Sarah Watson is a 48 y.o. female who is referred by family physician because of microcytic anemia. She is perimenopausal but did have a menstrual period this month. She denies any melena, hematochezia, hematuria, epistaxis, or hemoptysis. Urine is normal color. She denies any easy satiety, lymphadenopathy, hot flashes, diarrhea, constipation, nausea, vomiting, lower extremity swelling or redness, craving for ice but was slightly brittle fingernails and hair.   PAST MEDICAL HISTORY:  has a past medical history of NSVD (normal spontaneous vaginal delivery); Ectopic pregnancy; Diabetes mellitus; Oligomenorrhea; Eczema; and Anemia.     PAST SURGICAL HISTORY: Past Surgical History  Procedure Laterality Date  . Pelvic laparoscopy      RIGHT SALPINGECTOMY  . Tubal ligation    . Neuroma surgery  2008    REMOVAL OF LEFT EAR ACOUSTIC NEUROMA     CURRENT MEDICATIONS: has a current medication list which includes the following prescription(s): cholecalciferol and ferrous sulfate.   ALLERGIES: Penicillins   SOCIAL HISTORY:  reports that she has never smoked. She has never used smokeless tobacco. She reports that she does not drink alcohol or use illicit drugs.   FAMILY HISTORY: family history includes Cancer in her paternal grandfather; Diabetes in her paternal aunt.    REVIEW OF SYSTEMS:  Other than that discussed above is noncontributory.    PHYSICAL EXAM:  height is 5' 3.75" (1.619 m) and weight is 129 lb (58.514 kg). Her oral temperature is 98.6 F (37 C). Her blood pressure is 113/75 and her pulse is 99. Her respiration is 18.    GENERAL:alert, no distress and comfortable.  Pale SKIN: skin color, texture, turgor are normal, no rashes or significant lesions EYES: normal, Conjunctiva are pink and non-injected, sclera clear OROPHARYNX:no exudate, no erythema and lips, buccal mucosa, and tongue normal  NECK: supple, thyroid normal size, non-tender, without nodularity CHEST: Normal AP diameter with no breast masses. LYMPH:  no palpable lymphadenopathy in the cervical, axillary or inguinal LUNGS: clear to auscultation and percussion with normal breathing effort HEART: regular rate & rhythm and no murmurs ABDOMEN:abdomen soft, non-tender and normal bowel sounds. No organomegaly, ascites, or CVA tenderness. MUSCULOSKELETALl:no cyanosis of digits, no clubbing or edema. Fingernails pale. Ridging of fingernails.  NEURO: alert & oriented x 3 with fluent speech, no focal motor/sensory deficits    LABORATORY DATA:   02/28/2014: Outside laboratory WBC 6.0, hemoglobin 8.9, platelets 42,000 with MCV of 77. Serum iron 14, TIBC 277, B12 and folate levels were normal. Ferritin was not done.   No visits with results within 30 Day(s) from this visit. Latest known visit with results is:  Office Visit on 12/04/2013  Component Date Value Ref Range Status  . TSH 12/04/2013 1.215  0.350 - 4.500 uIU/mL Final  . HIV 12/04/2013 NON REACTIVE  NON REACTIVE Final  . [facility] 12/04/2013 35.1   Final   Comment: Reference Ranges:                                   Female:  1.4 -  18.1 mIU/mL                                   Female:   Follicular Phase    2.5 -  10.2 mIU/mL                                             MidCycle Peak       3.4 -  33.4 mIU/mL                                             Luteal Phase        1.5 -   9.1 mIU/mL                                             Post Menopausal    23.0 - 116.3 mIU/mL                                             Pregnant                <   0.3 mIU/mL    Urinalysis    Component Value Date/Time   COLORURINE YELLOW 06/20/2012  0000   APPEARANCEUR CLOUDY* 06/20/2012 0000   LABSPEC 1.015 06/20/2012 0000   PHURINE 7.5 06/20/2012 0000   GLUCOSEU NEG 06/20/2012 0000   HGBUR TRACE* 06/20/2012 0000   BILIRUBINUR NEG 06/20/2012 0000   KETONESUR NEG 06/20/2012 0000   PROTEINUR 30* 06/20/2012 0000   UROBILINOGEN 0.2 06/20/2012 0000   NITRITE NEG 06/20/2012 0000   LEUKOCYTESUR LARGE* 06/20/2012 0000      @RADIOGRAPHY : No results found.  PATHOLOGY: Peripheral smear reveals hypochromic microcytic cells.   IMPRESSION:  #1. Probable iron deficiency anemia. #2. Perimenopausal state #3. Anxiety neurosis.   PLAN:  #1. If ferritin is low, intravenous iron will be given on 03/27/2014. #2. Office visit with CBC and ferritin in 4 weeks.  Farrel Gobble, MD 03/24/2014 3:54 PM   DISCLAIMER:  This note was dictated with voice recognition softwre.  Similar sounding words can inadvertently be transcribed inaccurately and may not be corrected upon review.

## 2014-03-24 NOTE — Addendum Note (Signed)
Addended by: Mellissa Kohut on: 03/24/2014 04:20 PM   Modules accepted: Orders

## 2014-03-24 NOTE — Progress Notes (Signed)
Sarah Watson presented for labwork. Labs per MD order drawn via Peripheral Line 23 gauge needle inserted in left AC  Good blood return present. Procedure without incident.  Needle removed intact. Patient tolerated procedure well.

## 2014-03-25 ENCOUNTER — Ambulatory Visit (HOSPITAL_COMMUNITY): Payer: BC Managed Care – PPO

## 2014-03-26 ENCOUNTER — Other Ambulatory Visit (HOSPITAL_COMMUNITY): Payer: Self-pay | Admitting: Hematology and Oncology

## 2014-03-26 LAB — ANTI-PARIETAL ANTIBODY: Parietal Cell Antibody-IgG: NEGATIVE

## 2014-03-26 NOTE — Progress Notes (Signed)
This encounter was created in error - please disregard.

## 2014-03-27 ENCOUNTER — Ambulatory Visit (HOSPITAL_COMMUNITY): Payer: BC Managed Care – PPO

## 2014-03-27 ENCOUNTER — Encounter (HOSPITAL_BASED_OUTPATIENT_CLINIC_OR_DEPARTMENT_OTHER): Payer: BC Managed Care – PPO

## 2014-03-27 VITALS — BP 107/64 | HR 78 | Temp 98.3°F | Resp 16

## 2014-03-27 DIAGNOSIS — D649 Anemia, unspecified: Secondary | ICD-10-CM

## 2014-03-27 MED ORDER — SODIUM CHLORIDE 0.9 % IV SOLN
Freq: Once | INTRAVENOUS | Status: AC
  Administered 2014-03-27: 09:00:00 via INTRAVENOUS

## 2014-03-27 MED ORDER — SODIUM CHLORIDE 0.9 % IV SOLN
1020.0000 mg | Freq: Once | INTRAVENOUS | Status: AC
  Administered 2014-03-27: 1020 mg via INTRAVENOUS
  Filled 2014-03-27: qty 34

## 2014-03-27 MED ORDER — HEPARIN SOD (PORK) LOCK FLUSH 100 UNIT/ML IV SOLN: 500.0000 [IU] | Freq: Once | INTRAVENOUS | Status: DC | PRN

## 2014-03-27 MED ORDER — SODIUM CHLORIDE 0.9 % IJ SOLN
10.0000 mL | INTRAMUSCULAR | Status: DC | PRN
  Administered 2014-03-27: 10 mL

## 2014-03-27 NOTE — Progress Notes (Signed)
Tolerated well

## 2014-04-03 LAB — IRON AND TIBC
Iron: 12 ug/dL — ABNORMAL LOW (ref 42–135)
Saturation Ratios: 4 % — ABNORMAL LOW (ref 20–55)
TIBC: 307 ug/dL (ref 250–470)
UIBC: 295 ug/dL (ref 125–400)

## 2014-04-03 LAB — HEMOGLOBINOPATHY EVALUATION
HGB S QUANTITAION: 0 %
Hemoglobin Other: 0 %
Hgb A2 Quant: 2.3 % (ref 2.2–3.2)
Hgb A: 97.7 % (ref 96.8–97.8)
Hgb F Quant: 0 % (ref 0.0–2.0)

## 2014-04-03 LAB — INTRINSIC FACTOR ANTIBODIES: INTRINSIC FACTOR: NEGATIVE

## 2014-04-03 LAB — FERRITIN: Ferritin: 45 ng/mL (ref 10–291)

## 2014-04-09 ENCOUNTER — Ambulatory Visit: Payer: BC Managed Care – PPO | Admitting: Gastroenterology

## 2014-04-13 LAB — JAK2 GENOTYPR: JAK2 GenotypR: NOT DETECTED

## 2014-04-21 ENCOUNTER — Encounter (HOSPITAL_BASED_OUTPATIENT_CLINIC_OR_DEPARTMENT_OTHER): Payer: BC Managed Care – PPO

## 2014-04-21 ENCOUNTER — Ambulatory Visit (HOSPITAL_COMMUNITY): Payer: BC Managed Care – PPO

## 2014-04-21 ENCOUNTER — Encounter (HOSPITAL_COMMUNITY): Payer: BC Managed Care – PPO | Attending: Hematology and Oncology

## 2014-04-21 ENCOUNTER — Encounter (HOSPITAL_COMMUNITY): Payer: Self-pay

## 2014-04-21 VITALS — BP 112/65 | HR 91 | Temp 98.6°F | Resp 18 | Wt 125.0 lb

## 2014-04-21 DIAGNOSIS — D473 Essential (hemorrhagic) thrombocythemia: Secondary | ICD-10-CM

## 2014-04-21 DIAGNOSIS — D649 Anemia, unspecified: Secondary | ICD-10-CM

## 2014-04-21 DIAGNOSIS — D75839 Thrombocytosis, unspecified: Secondary | ICD-10-CM

## 2014-04-21 DIAGNOSIS — D509 Iron deficiency anemia, unspecified: Secondary | ICD-10-CM

## 2014-04-21 DIAGNOSIS — E611 Iron deficiency: Secondary | ICD-10-CM

## 2014-04-21 DIAGNOSIS — F411 Generalized anxiety disorder: Secondary | ICD-10-CM

## 2014-04-21 LAB — CBC WITH DIFFERENTIAL/PLATELET
Basophils Absolute: 0.1 10*3/uL (ref 0.0–0.1)
Basophils Relative: 1 % (ref 0–1)
EOS PCT: 0 % (ref 0–5)
Eosinophils Absolute: 0 10*3/uL (ref 0.0–0.7)
HCT: 30.4 % — ABNORMAL LOW (ref 36.0–46.0)
Hemoglobin: 9.5 g/dL — ABNORMAL LOW (ref 12.0–15.0)
LYMPHS ABS: 1.3 10*3/uL (ref 0.7–4.0)
LYMPHS PCT: 15 % (ref 12–46)
MCH: 26 pg (ref 26.0–34.0)
MCHC: 31.3 g/dL (ref 30.0–36.0)
MCV: 83.3 fL (ref 78.0–100.0)
Monocytes Absolute: 0.7 10*3/uL (ref 0.1–1.0)
Monocytes Relative: 8 % (ref 3–12)
NEUTROS PCT: 76 % (ref 43–77)
Neutro Abs: 6.7 10*3/uL (ref 1.7–7.7)
Platelets: 547 10*3/uL — ABNORMAL HIGH (ref 150–400)
RBC: 3.65 MIL/uL — AB (ref 3.87–5.11)
RDW: 18.4 % — ABNORMAL HIGH (ref 11.5–15.5)
WBC: 8.8 10*3/uL (ref 4.0–10.5)

## 2014-04-21 LAB — FERRITIN: FERRITIN: 446 ng/mL — AB (ref 10–291)

## 2014-04-21 NOTE — Progress Notes (Signed)
Labs drawn for cbc/diff, ferr,

## 2014-04-21 NOTE — Progress Notes (Signed)
[facility]  OFFICE PROGRESS NOTE  Leonides Grills, MD [place] [id number]  DIAGNOSIS: Iron deficiency  Thrombocytosis  Chief Complaint  Patient presents with  . Iron deficiency anemia    IV Feraheme 03/27/2014.    CURRENT THERAPY: Intravenous Feraheme on 03/27/2014 when hemoglobin was 8.7.  INTERVAL HISTORY: Sarah Watson 48 y.o. female returns for followup of microcytic anemia after additional laboratory testing she was found to have iron deficiency and was given Feraheme intravenously on 03/27/2014 . Her energy level has improved dramatically since the iron infusion. She still having severe hot flashes but denies any vaginal bleeding recently, melena, hematochezia, hematuria, epistaxis, or hemoptysis. She denies a muscle aches, joint pain, diarrhea, constipation, dysuria, lower extremity swelling or redness, or craving for ice.   MEDICAL HISTORY: Past Medical History  Diagnosis Date  . NSVD (normal spontaneous vaginal delivery)   . Ectopic pregnancy   . Diabetes mellitus     GESTATIONAL  . Oligomenorrhea   . Eczema   . Anemia     INTERIM HISTORY: has History of gestational diabetes; Weight gain; and Anemia on her problem list.    ALLERGIES:  is allergic to penicillins.  MEDICATIONS: has a current medication list which includes the following prescription(s): multiple vitamins-minerals and cholecalciferol.  SURGICAL HISTORY:  Past Surgical History  Procedure Laterality Date  . Pelvic laparoscopy      RIGHT SALPINGECTOMY  . Tubal ligation    . Neuroma surgery  2008    REMOVAL OF LEFT EAR ACOUSTIC NEUROMA    FAMILY HISTORY: family history includes Cancer in her paternal grandfather; Diabetes in her paternal aunt.  SOCIAL HISTORY:  reports that she has never smoked. She has never used smokeless tobacco. She reports that she does not drink alcohol or use illicit drugs.  REVIEW OF SYSTEMS:  Other than  that discussed above is noncontributory.  PHYSICAL EXAMINATION: ECOG PERFORMANCE STATUS: 0 - Asymptomatic  Blood pressure 112/65, pulse 91, temperature 98.6 F (37 C), temperature source Oral, resp. rate 18, weight 125 lb (56.7 kg).  GENERAL:alert, no distress and comfortable SKIN: skin color, texture, turgor are normal, no rashes or significant lesions EYES: PERLA; Conjunctiva are pink and non-injected, sclera clear SINUSES: No redness or tenderness over maxillary or ethmoid sinuses OROPHARYNX:no exudate, no erythema on lips, buccal mucosa, or tongue. NECK: supple, thyroid normal size, non-tender, without nodularity. No masses CHEST: Normal AP diameter with no breast masses. LYMPH:  no palpable lymphadenopathy in the cervical, axillary or inguinal LUNGS: clear to auscultation and percussion with normal breathing effort HEART: regular rate & rhythm and no murmurs. ABDOMEN:abdomen soft, non-tender and normal bowel sounds MUSCULOSKELETAL:no cyanosis of digits and no clubbing. Range of motion normal.  NEURO: alert & oriented x 3 with fluent speech, no focal motor/sensory deficits   LABORATORY DATA: Appointment on 04/21/2014  Component Date Value Ref Range Status  . WBC 04/21/2014 8.8  4.0 - 10.5 K/uL Final  . RBC 04/21/2014 3.65* 3.87 - 5.11 MIL/uL Final  . Hemoglobin 04/21/2014 9.5* 12.0 - 15.0 g/dL Final  . HCT 04/21/2014 30.4* 36.0 - 46.0 % Final  . MCV 04/21/2014 83.3  78.0 - 100.0 fL Final  . MCH 04/21/2014 26.0  26.0 - 34.0 pg Final  . MCHC 04/21/2014 31.3  30.0 - 36.0 g/dL Final  . RDW 04/21/2014 18.4* 11.5 - 15.5 % Final  . Platelets 04/21/2014 547* 150 - 400 K/uL Final  . Neutrophils  Relative % 04/21/2014 76  43 - 77 % Final  . Neutro Abs 04/21/2014 6.7  1.7 - 7.7 K/uL Final  . Lymphocytes Relative 04/21/2014 15  12 - 46 % Final  . Lymphs Abs 04/21/2014 1.3  0.7 - 4.0 K/uL Final  . Monocytes Relative 04/21/2014 8  3 - 12 % Final  . Monocytes Absolute 04/21/2014 0.7  0.1  - 1.0 K/uL Final  . Eosinophils Relative 04/21/2014 0  0 - 5 % Final  . Eosinophils Absolute 04/21/2014 0.0  0.0 - 0.7 K/uL Final  . Basophils Relative 04/21/2014 1  0 - 1 % Final  . Basophils Absolute 04/21/2014 0.1  0.0 - 0.1 K/uL Final  Office Visit on 03/24/2014  Component Date Value Ref Range Status  . Iron 03/24/2014 12* 42 - 135 ug/dL Final  . TIBC 57/81/4025 307  250 - 470 ug/dL Final  . Saturation Ratios 03/24/2014 4* 20 - 55 % Final  . UIBC 03/24/2014 295  125 - 400 ug/dL Final   Performed at [facility]  . Intrinsic Factor 03/24/2014 Negative  Negative Final   Performed at [facility]  . Parietal Cell Antibody-IgG 03/24/2014 Negative  Negative Final   Performed at [facility]  . WBC 03/24/2014 8.6  4.0 - 10.5 K/uL Final  . RBC 03/24/2014 3.52* 3.87 - 5.11 MIL/uL Final  . Hemoglobin 03/24/2014 8.7* 12.0 - 15.0 g/dL Final  . HCT 92/70/1784 27.8* 36.0 - 46.0 % Final  . MCV 03/24/2014 79.0  78.0 - 100.0 fL Final  . MCH 03/24/2014 24.7* 26.0 - 34.0 pg Final  . MCHC 03/24/2014 31.3  30.0 - 36.0 g/dL Final  . RDW 33/32/7457 17.4* 11.5 - 15.5 % Final  . Platelets 03/24/2014 691* 150 - 400 K/uL Final  . Neutrophils Relative % 03/24/2014 74  43 - 77 % Final  . Neutro Abs 03/24/2014 6.4  1.7 - 7.7 K/uL Final  . Lymphocytes Relative 03/24/2014 17  12 - 46 % Final  . Lymphs Abs 03/24/2014 1.5  0.7 - 4.0 K/uL Final  . Monocytes Relative 03/24/2014 7  3 - 12 % Final  . Monocytes Absolute 03/24/2014 0.6  0.1 - 1.0 K/uL Final  . Eosinophils Relative 03/24/2014 1  0 - 5 % Final  . Eosinophils Absolute 03/24/2014 0.1  0.0 - 0.7 K/uL Final  . Basophils Relative 03/24/2014 1  0 - 1 % Final  . Basophils Absolute 03/24/2014 0.0  0.0 - 0.1 K/uL Final  . Sodium 03/24/2014 136* 137 - 147 mEq/L Final  . Potassium 03/24/2014 4.5  3.7 - 5.3 mEq/L Final  . Chloride 03/24/2014 99  96 - 112 mEq/L Final  . CO2 03/24/2014 27  19 - 32 mEq/L Final  . Glucose, Bld 03/24/2014  93  70 - 99 mg/dL Final  . BUN 93/96/5052 18  6 - 23 mg/dL Final  . Creatinine, Ser 03/24/2014 1.07  0.50 - 1.10 mg/dL Final  . Calcium 57/98/8767 9.2  8.4 - 10.5 mg/dL Final  . Total Protein 03/24/2014 9.2* 6.0 - 8.3 g/dL Final  . Albumin 81/18/3740 3.2* 3.5 - 5.2 g/dL Final  . AST 12/27/689 17  0 - 37 U/L Final  . ALT 03/24/2014 10  0 - 35 U/L Final  . Alkaline Phosphatase 03/24/2014 82  39 - 117 U/L Final  . Total Bilirubin 03/24/2014 0.2* 0.3 - 1.2 mg/dL Final  . GFR calc non Af Amer 03/24/2014 61* >90 mL/min Final  . GFR calc  Af Amer 03/24/2014 70* >90 mL/min Final   Comment: (NOTE)                          The eGFR has been calculated using the CKD EPI equation.                          This calculation has not been validated in all clinical situations.                          eGFR's persistently <90 mL/min signify possible Chronic Kidney                          Disease.  Marland Kitchen LDH 03/24/2014 189  94 - 250 U/L Final  . Hgb A2 Quant 03/24/2014 2.3  2.2 - 3.2 % Final   Comment: (NOTE)                          Patients with the combination of iron deficiency and B-thalassemia may                          clinically present with normal A2 level.  An elevated A2 level can not                          be used to screen for beta-thalassemia in these cases.  . Hgb F Quant 03/24/2014 0.0  0.0 - 2.0 % Final  . Hgb S Quant 03/24/2014 0.0  0.0 % Final  . Hgb A 03/24/2014 97.7  96.8 - 97.8 % Final  . Hemoglobin Other 03/24/2014 0.0  0.0 % Final   Comment: (NOTE)                          Interpretation                          --------------                          Normal study.                          Reviewed by Francis Gaines Mammarappallil MD Paramedic on File)                          Performed at [facility]  . JAK2 GenotypR 03/24/2014 Not Detected   Final   Comment: (NOTE)                                   ** Normal Reference Range: Not Detected **                           Clinical Utility:                          The somatic acquired mutation affecting Janus Tyrosine Kinase 2 (JAK2  V617F) in exon 14 is associated with myeloproliferative disorders                          (MPD).  JAK2 V617F has been found to be the most common molecular                          abnormality in patients with Polycythemia Vera (PV, >90%) or Essential                          Thombocythemia (ET, 35% - 70%).  The lowest frequency is found in IMF                          patients (chronic Idiopathic Myelofibrosis, 50%).  The presence of the                          JAK2 mutation causes activation of molecular signals that lead to                          proliferation of hematopoietic precursors outside of their normal                          pathways including erythropoietin-independent erythroid colony growth                          in most patients with PV and some patients with ET.  The JAK2 mutation                          is considered the main oncogenic event responsible for PV development                          but its precise role in ET and IMF remains questionable and may                          suggest the requirement of other genetic events to induce these                          pathologies.  The absence of JAK2 V617F does not exclude other                          changes, including in the exon 12.                          Test Methodology:                          Patient DNA is assayed using allele specific PCR technology from                          Qiagen and is tested using the Roche Light Cycler Real Time PCR                          analyzer. This assay is reported as detected when >5%  of cells show                          the presence of the JAK2 V617F mutation.                          This test was performed using a kit that has not been cleared or                          approved by the FDA.  The analytical  performance characteristics of                          this test have been determined by [facility].  This                          test may not be used for diagnostic, prognostic or monitoring purposes                          without confirmation by other medically established means.                          Performed at [facility]  . Retic Ct Pct 03/24/2014 1.2  0.4 - 3.1 % Final  . RBC. 03/24/2014 3.52* 3.87 - 5.11 MIL/uL Final  . Retic Count, Manual 03/24/2014 42.2  19.0 - 186.0 K/uL Final  . Ferritin 03/24/2014 45  10 - 291 ng/mL Final   Performed at [facility]: No new pathology.  Urinalysis    Component Value Date/Time   COLORURINE YELLOW 06/20/2012 0000   APPEARANCEUR CLOUDY* 06/20/2012 0000   LABSPEC 1.015 06/20/2012 0000   PHURINE 7.5 06/20/2012 0000   GLUCOSEU NEG 06/20/2012 0000   HGBUR TRACE* 06/20/2012 0000   BILIRUBINUR NEG 06/20/2012 0000   KETONESUR NEG 06/20/2012 0000   PROTEINUR 30* 06/20/2012 0000   UROBILINOGEN 0.2 06/20/2012 0000   NITRITE NEG 06/20/2012 0000   LEUKOCYTESUR LARGE* 06/20/2012 0000    RADIOGRAPHIC STUDIES: No results found.  ASSESSMENT:  #1. Iron deficiency anemia, status post Feraheme infusion with improvement in symptoms and blood count. #2. Perimenopausal state with vasomotor instability. #3. Anxiety neurosis.   PLAN:  #1. The patient was reassured. #2. She was told to contact her PCP regarding endocrine replacement therapy for vasomotor instability. #3. Followup in 6 months with CBC and ferritin.   All questions were answered. The patient knows to call the clinic with any problems, questions or concerns. We can certainly see the patient much sooner if necessary.   I spent 25 minutes counseling the patient face to face. The total time spent in the appointment was 30 minutes.    Farrel Gobble, MD 04/21/2014 10:56 AM  DISCLAIMER:  This note was dictated with voice recognition software.  Similar  sounding words can inadvertently be transcribed inaccurately and may not be corrected upon review.

## 2014-04-21 NOTE — Patient Instructions (Signed)
[facility] Discharge Instructions  RECOMMENDATIONS MADE BY THE CONSULTANT AND ANY TEST RESULTS WILL BE SENT TO YOUR REFERRING PHYSICIAN.  Return in 6 months for office visit and blood work.  Thank you for choosing [facility] to provide your oncology and hematology care.  To afford each patient quality time with our providers, please arrive at least 15 minutes before your scheduled appointment time.  With your help, our goal is to use those 15 minutes to complete the necessary work-up to ensure our physicians have the information they need to help with your evaluation and healthcare recommendations.    Effective January 1st, 2014, we ask that you re-schedule your appointment with our physicians should you arrive 10 or more minutes late for your appointment.  We strive to give you quality time with our providers, and arriving late affects you and other patients whose appointments are after yours.    Again, thank you for choosing [facility].  Our hope is that these requests will decrease the amount of time that you wait before being seen by our physicians.       _____________________________________________________________  Should you have questions after your visit to [facility], please contact our office at [phone] between the hours of 8:30 a.m. and 5:00 p.m.  Voicemails left after 4:30 p.m. will not be returned until the following business day.  For prescription refill requests, have your pharmacy contact our office with your prescription refill request.

## 2014-06-15 ENCOUNTER — Encounter: Payer: Self-pay | Admitting: Gastroenterology

## 2014-07-16 ENCOUNTER — Encounter (INDEPENDENT_AMBULATORY_CARE_PROVIDER_SITE_OTHER): Payer: Self-pay

## 2014-07-16 ENCOUNTER — Encounter: Payer: Self-pay | Admitting: Gastroenterology

## 2014-07-16 ENCOUNTER — Ambulatory Visit (INDEPENDENT_AMBULATORY_CARE_PROVIDER_SITE_OTHER): Payer: BC Managed Care – PPO | Admitting: Gastroenterology

## 2014-07-16 VITALS — BP 100/61 | HR 86 | Temp 97.8°F | Ht 63.5 in | Wt 120.8 lb

## 2014-07-16 DIAGNOSIS — D508 Other iron deficiency anemias: Secondary | ICD-10-CM

## 2014-07-16 DIAGNOSIS — R634 Abnormal weight loss: Secondary | ICD-10-CM

## 2014-07-16 MED ORDER — PEG 3350-KCL-NA BICARB-NACL 420 G PO SOLR: 4000.0000 mL | ORAL | Status: DC

## 2014-07-16 NOTE — Progress Notes (Signed)
Referring Provider: Elsie Lincoln, MD Primary Care Physician:  Leonides Grills, MD Primary Gastroenterologist:  Dr. Oneida Alar   Chief Complaint  Patient presents with  . Weight Loss    and colonoscopy    HPI:   Sarah Watson presents today with a history of IDA, last seen by hematology in June 2015. Received Feraheme May 2015. No constipation, diarrhea. No hematochezia, no melena. No dysphagia. Notes weight loss of about 30 lbs in the past 8 months. Decreased appetite. 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 No N/V. Forces herself to eat.   Past Medical History  Diagnosis Date  . NSVD (normal spontaneous vaginal delivery)   . Ectopic pregnancy   . Gestational diabetes   . Oligomenorrhea   . Eczema   . Anemia     Past Surgical History  Procedure Laterality Date  . Pelvic laparoscopy      RIGHT SALPINGECTOMY  . Tubal ligation    . Neuroma surgery  2008    REMOVAL OF LEFT EAR ACOUSTIC NEUROMA    Current Outpatient Prescriptions  Medication Sig Dispense Refill  . cholecalciferol (VITAMIN D) 1000 UNITS tablet Take 1,000 Units by mouth daily.      . Multiple Vitamins-Minerals (MULTIVITAMIN PO) Take 1 tablet by mouth daily. States she takes this when she remembers it       No current facility-administered medications for this visit.    Allergies as of 07/16/2014 - Review Complete 07/16/2014  Allergen Reaction Noted  . Penicillins Anaphylaxis 06/09/2011    Family History  Problem Relation Age of Onset  . Diabetes Paternal Aunt   . Cancer Paternal Grandfather     PROSTATE  . Colon cancer Neg Hx     History   Social History  . Marital Status: Married    Spouse Name: N/A    Number of Children: N/A  . Years of Education: N/A   Occupational History  . 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 Smoking status: Never Smoker   . Smokeless tobacco: Never Used     Comment: Never smoked   . Alcohol Use: No  . Drug Use: No  . Sexual Activity: Yes    Birth Control/ Protection: None   Other Topics Concern  . Not on file   Social History Narrative  . No narrative on file    Review of Systems: As mentioned in HPI  Physical Exam: BP 100/61  Pulse 86  Temp(Src) 97.8 F (36.6 C) (Oral)  Ht 5' 3.5" (1.613 m)  Wt 120 lb 12.8 oz (54.795 kg)  BMI 21.06 kg/m2  LMP 11/20/2013 General:   Alert and oriented. Well-developed, well-nourished, pleasant and cooperative. Tearful.  Head:  Normocephalic and atraumatic. Eyes:  Conjunctiva pink, sclera clear, no icterus.   Conjunctiva pink. Ears:  Normal auditory acuity. Nose:  No deformity, discharge,  or lesions. Mouth:  No deformity or lesions, mucosa pink and moist.  Lungs:  Clear to auscultation bilaterally, without wheezing, rales, or rhonchi.  Heart:  S1, S2 present without murmurs noted.  Abdomen:  +BS, soft, non-tender and non-distended. Without mass or HSM. No rebound or guarding. No hernias noted. Rectal:  Deferred  Msk:  Symmetrical without gross deformities. Normal posture. Extremities:  Without clubbing or edema. Neurologic:  Alert and  oriented x4;  grossly normal neurologically. Skin:  Intact, warm and dry without significant lesions or rashes Psych:  Alert and  cooperative. Slightly tearful.

## 2014-07-16 NOTE — Patient Instructions (Signed)
We have scheduled you for a colonoscopy and upper endoscopy with Dr. Fields in the near future.  Further recommendations to follow   

## 2014-07-21 NOTE — Assessment & Plan Note (Signed)
Likely multifactorial with large stress component. TCS/EGD as planned.

## 2014-07-21 NOTE — Assessment & Plan Note (Signed)
48 year old female with history of IDA requiring infusion, unknown hemoccult status, presenting with close to 30 lbs self-reported weight loss but without overt signs of GI bleeding. No changes in bowel habits but does note decreased appetite. Needs TCS/EGD due to IDA to rule out occult GI process.   Proceed with colonoscopy/EGD with Dr. Oneida Alar in the near future. The risks, benefits, and alternatives have been discussed in detail with the patient. They state understanding and desire to proceed.

## 2014-07-22 ENCOUNTER — Encounter (HOSPITAL_COMMUNITY): Payer: Self-pay | Admitting: Pharmacy Technician

## 2014-07-22 NOTE — Progress Notes (Signed)
cc'ed to pcp °

## 2014-08-03 ENCOUNTER — Encounter (HOSPITAL_COMMUNITY)
Admission: RE | Disposition: A | Discharge status: Home / Self Care | Payer: Self-pay | Source: Ambulatory Visit | Attending: Gastroenterology

## 2014-08-03 ENCOUNTER — Encounter (HOSPITAL_COMMUNITY): Payer: Self-pay | Admitting: *Deleted

## 2014-08-03 ENCOUNTER — Ambulatory Visit (HOSPITAL_COMMUNITY)
Admission: RE | Admit: 2014-08-03 | Discharge: 2014-08-03 | Disposition: A | Discharge status: Home / Self Care | Payer: BC Managed Care – PPO | Source: Ambulatory Visit | Attending: Gastroenterology | Admitting: Gastroenterology

## 2014-08-03 DIAGNOSIS — K449 Diaphragmatic hernia without obstruction or gangrene: Secondary | ICD-10-CM | POA: Diagnosis not present

## 2014-08-03 DIAGNOSIS — R634 Abnormal weight loss: Secondary | ICD-10-CM

## 2014-08-03 DIAGNOSIS — K2940 Chronic atrophic gastritis without bleeding: Secondary | ICD-10-CM | POA: Insufficient documentation

## 2014-08-03 DIAGNOSIS — D509 Iron deficiency anemia, unspecified: Secondary | ICD-10-CM | POA: Diagnosis not present

## 2014-08-03 DIAGNOSIS — Q438 Other specified congenital malformations of intestine: Secondary | ICD-10-CM | POA: Insufficient documentation

## 2014-08-03 DIAGNOSIS — K648 Other hemorrhoids: Secondary | ICD-10-CM | POA: Diagnosis not present

## 2014-08-03 DIAGNOSIS — D508 Other iron deficiency anemias: Secondary | ICD-10-CM

## 2014-08-03 HISTORY — PX: COLONOSCOPY: SHX5424

## 2014-08-03 HISTORY — PX: ESOPHAGOGASTRODUODENOSCOPY: SHX5428

## 2014-08-03 SURGERY — COLONOSCOPY
Anesthesia: Moderate Sedation

## 2014-08-03 MED ORDER — MIDAZOLAM HCL 5 MG/5ML IJ SOLN
INTRAMUSCULAR | Status: DC | PRN
  Administered 2014-08-03 (×2): 2 mg via INTRAVENOUS
  Administered 2014-08-03 (×2): 1 mg via INTRAVENOUS
  Administered 2014-08-03: 2 mg via INTRAVENOUS

## 2014-08-03 MED ORDER — MEPERIDINE HCL 100 MG/ML IJ SOLN
INTRAMUSCULAR | Status: DC | PRN
  Administered 2014-08-03 (×4): 25 mg via INTRAVENOUS

## 2014-08-03 MED ORDER — MIDAZOLAM HCL 5 MG/5ML IJ SOLN
INTRAMUSCULAR | Status: AC
  Filled 2014-08-03: qty 10

## 2014-08-03 MED ORDER — LIDOCAINE VISCOUS 2 % MT SOLN
OROMUCOSAL | Status: DC | PRN
  Administered 2014-08-03: 1 via OROMUCOSAL

## 2014-08-03 MED ORDER — LIDOCAINE VISCOUS 2 % MT SOLN
OROMUCOSAL | Status: AC
  Filled 2014-08-03: qty 15

## 2014-08-03 MED ORDER — SODIUM CHLORIDE 0.9 % IV SOLN
INTRAVENOUS | Status: DC
  Administered 2014-08-03: 09:00:00 via INTRAVENOUS

## 2014-08-03 MED ORDER — MINERAL OIL PO OIL
TOPICAL_OIL | ORAL | Status: AC
  Filled 2014-08-03: qty 30

## 2014-08-03 MED ORDER — PROMETHAZINE HCL 25 MG/ML IJ SOLN
INTRAMUSCULAR | Status: AC
  Filled 2014-08-03: qty 1

## 2014-08-03 MED ORDER — STERILE WATER FOR IRRIGATION IR SOLN
Status: DC | PRN
  Administered 2014-08-03: 10:00:00

## 2014-08-03 MED ORDER — PROMETHAZINE HCL 25 MG/ML IJ SOLN
INTRAMUSCULAR | Status: DC | PRN
  Administered 2014-08-03: 12.5 mg via INTRAVENOUS

## 2014-08-03 MED ORDER — MEPERIDINE HCL 100 MG/ML IJ SOLN
INTRAMUSCULAR | Status: AC
  Filled 2014-08-03: qty 2

## 2014-08-03 MED ORDER — TRAZODONE HCL 50 MG PO TABS: ORAL_TABLET | ORAL | Status: DC

## 2014-08-03 NOTE — H&P (Signed)
  Primary Care Physician:  Purvis Kilts, MD Primary Gastroenterologist:  Dr. Oneida Alar  Pre-Procedure History & Physical: HPI:  Sarah Watson is a 48 y.o. female here for  ANEMIA.  Past Medical History  Diagnosis Date  . NSVD (normal spontaneous vaginal delivery)   . Ectopic pregnancy   . Gestational diabetes   . Oligomenorrhea   . Eczema   . Anemia     Past Surgical History  Procedure Laterality Date  . Pelvic laparoscopy      RIGHT SALPINGECTOMY  . Tubal ligation    . Neuroma surgery  2008    REMOVAL OF LEFT EAR ACOUSTIC NEUROMA    Prior to Admission medications   Medication Sig Start Date End Date Taking? Authorizing Provider  cholecalciferol (VITAMIN D) 1000 UNITS tablet Take 1,000 Units by mouth daily.   Yes Historical Provider, MD  Multiple Vitamins-Minerals (MULTIVITAMIN PO) Take 1 tablet by mouth daily. States she takes this when she remembers it   Yes Historical Provider, MD    Allergies as of 07/16/2014 - Review Complete 07/16/2014  Allergen Reaction Noted  . Penicillins Anaphylaxis 06/09/2011    Family History  Problem Relation Age of Onset  . Diabetes Paternal Aunt   . Cancer Paternal Grandfather     PROSTATE  . Colon cancer Neg Hx     History   Social History  . Marital Status: Married    Spouse Name: N/A    Number of Children: N/A  . Years of Education: N/A   Occupational History  . 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 Smoking status: Never Smoker   . Smokeless tobacco: Never Used     Comment: Never smoked  . Alcohol Use: No  . Drug Use: No  . Sexual Activity: Yes    Birth Control/ Protection: None   Other Topics Concern  . Not on file   Social History Narrative  . No narrative on file    Review of Systems: See HPI, otherwise negative ROS   Physical Exam: BP 102/70  Pulse 84  Temp(Src) 98.5 F (36.9 C) (Oral)  Resp 18  SpO2 98%  LMP 11/20/2013 General:   Alert,   pleasant and cooperative in NAD Head:  Normocephalic and atraumatic. Neck:  Supple; Lungs:  Clear throughout to auscultation.    Heart:  Regular rate and rhythm. Abdomen:  Soft, nontender and nondistended. Normal bowel sounds, without guarding, and without rebound.   Neurologic:  Alert and  oriented x4;  grossly normal neurologically.  Impression/Plan:     Anemia  PLAN: EGD/TCS TODAY

## 2014-08-03 NOTE — Progress Notes (Signed)
REVIEWED-NO ADDITIONAL RECOMMENDATIONS. 

## 2014-08-03 NOTE — Discharge Instructions (Signed)
NO SOURCE FOR YOUR LOW BLOOD COUNT WAS IDENTIFIED. YOUR COLON SMALL BOWEL, AND STOMACH APPEAR NORMAL. You HAVEinternal hemorrhoids.  I biopsied your stomach, SMALL BOWEL, AND COLON.   START TRAZODONE TO STIMULATE YOUR APPETITE. IT CAN CAUSE DROWSINESS, SUICIDAL THOUGHTS, OR WEIRD DREAMS.  FOLLOW A HIGH FIBER DIET. AVOID ITEMS THAT CAUSE BLOATING. SEE INFO BELOW.  YOUR BIOPSY RESULTS WILL BE BACK IN 14 DAYS.  Next colonoscopy in 10 years.   ENDOSCOPY Care After Read the instructions outlined below and refer to this sheet in the next week. These discharge instructions provide you with general information on caring for yourself after you leave the hospital. While your treatment has been planned according to the most current medical practices available, unavoidable complications occasionally occur. If you have any problems or questions after discharge, call DR. Lenox Bink, [phone].  ACTIVITY  You may resume your regular activity, but move at a slower pace for the next 24 hours.   Take frequent rest periods for the next 24 hours.   Walking will help get rid of the air and reduce the bloated feeling in your belly (abdomen).   No driving for 24 hours (because of the medicine (anesthesia) used during the test).   You may shower.   Do not sign any important legal documents or operate any machinery for 24 hours (because of the anesthesia used during the test).    NUTRITION  Drink plenty of fluids.   You may resume your normal diet as instructed by your doctor.   Begin with a light meal and progress to your normal diet. Heavy or fried foods are harder to digest and may make you feel sick to your stomach (nauseated).   Avoid alcoholic beverages for 24 hours or as instructed.    MEDICATIONS  You may resume your normal medications.   WHAT YOU CAN EXPECT TODAY  Some feelings of bloating in the abdomen.   Passage of more gas than usual.   Spotting of blood in your stool or on  the toilet paper  .  IF YOU HAD POLYPS REMOVED DURING THE ENDOSCOPY:  Eat a soft diet IF YOU HAVE NAUSEA, BLOATING, ABDOMINAL PAIN, OR VOMITING.    FINDING OUT THE RESULTS OF YOUR TEST Not all test results are available during your visit. DR. Oneida Alar WILL CALL YOU WITHIN 14 DAYS OF YOUR PROCEDUE WITH YOUR RESULTS. Do not assume everything is normal if you have not heard from DR. Hillarie Harrigan IN TWO WEEKS, CALL HER OFFICE AT [phone].  SEEK IMMEDIATE MEDICAL ATTENTION AND CALL THE OFFICE: [phone] IF:  You have more than a spotting of blood in your stool.   Your belly is swollen (abdominal distention).   You are nauseated or vomiting.   You have a temperature over 101F.   You have abdominal pain or discomfort that is severe or gets worse throughout the day.    High-Fiber Diet A high-fiber diet changes your normal diet to include more whole grains, legumes, fruits, and vegetables. Changes in the diet involve replacing refined carbohydrates with unrefined foods. The calorie level of the diet is essentially unchanged. The Dietary Reference Intake (recommended amount) for adult males is 38 grams per day. For adult females, it is 25 grams per day. Pregnant and lactating women should consume 28 grams of fiber per day. Fiber is the intact part of a plant that is not broken down during digestion. Functional fiber is fiber that has been isolated from the plant to provide a beneficial effect  in the body. PURPOSE  Increase stool bulk.   Ease and regulate bowel movements.   Lower cholesterol.  INDICATIONS THAT YOU NEED MORE FIBER  Constipation and hemorrhoids.   Uncomplicated diverticulosis (intestine condition) and irritable bowel syndrome.   Weight management.   As a protective measure against hardening of the arteries (atherosclerosis), diabetes, and cancer.   GUIDELINES FOR INCREASING FIBER IN THE DIET  Start adding fiber to the diet slowly. A gradual increase of about 5 more  grams (2 slices of whole-wheat bread, 2 servings of most fruits or vegetables, or 1 bowl of high-fiber cereal) per day is best. Too rapid an increase in fiber may result in constipation, flatulence, and bloating.   Drink enough water and fluids to keep your urine clear or pale yellow. Water, juice, or caffeine-free drinks are recommended. Not drinking enough fluid may cause constipation.   Eat a variety of high-fiber foods rather than one type of fiber.   Try to increase your intake of fiber through using high-fiber foods rather than fiber pills or supplements that contain small amounts of fiber.   The goal is to change the types of food eaten. Do not supplement your present diet with high-fiber foods, but replace foods in your present diet.  INCLUDE A VARIETY OF FIBER SOURCES  Replace refined and processed grains with whole grains, canned fruits with fresh fruits, and incorporate other fiber sources. White rice, white breads, and most bakery goods contain little or no fiber.   [place] whole-grain rice, buckwheat oats, and many fruits and vegetables are all good sources of fiber. These include: broccoli, Brussels sprouts, cabbage, cauliflower, beets, sweet potatoes, white potatoes (skin on), carrots, tomatoes, eggplant, squash, berries, fresh fruits, and dried fruits.   Cereals appear to be the richest source of fiber. Cereal fiber is found in whole grains and bran. Bran is the fiber-rich outer coat of cereal grain, which is largely removed in refining. In whole-grain cereals, the bran remains. In breakfast cereals, the largest amount of fiber is found in those with "bran" in their names. The fiber content is sometimes indicated on the label.   You may need to include additional fruits and vegetables each day.   In baking, for 1 cup white flour, you may use the following substitutions:   1 cup whole-wheat flour minus 2 tablespoons.   1/2 cup white flour plus 1/2 cup whole-wheat flour.   Hiatal  Hernia A hiatal hernia occurs when a part of the stomach slides above the diaphragm. The diaphragm is the thin muscle separating the belly (abdomen) from the chest. A hiatal hernia can be something you are born with or develop over time. Hiatal hernias may allow stomach acid to flow back into your esophagus, the tube which carries food from your mouth to your stomach. If this acid causes problems it is called GERD (gastro-esophageal reflux disease).     Hemorrhoids Hemorrhoids are dilated (enlarged) veins around the rectum. Sometimes clots will form in the veins. This makes them swollen and painful. These are called thrombosed hemorrhoids. Causes of hemorrhoids include:  Constipation.   Straining to have a bowel movement.   HEAVY LIFTING HOME CARE INSTRUCTIONS  Eat a well balanced diet and drink 6 to 8 glasses of water every day to avoid constipation. You may also use a bulk laxative.   Avoid straining to have bowel movements.   Keep anal area dry and clean.   Do not use a donut shaped pillow or sit on the  toilet for long periods. This increases blood pooling and pain.   Move your bowels when your body has the urge; this will require less straining and will decrease pain and pressure.

## 2014-08-04 NOTE — Op Note (Addendum)
[facility] [address] [place], [address]   COLONOSCOPY PROCEDURE REPORT     EXAM DATE: 08/03/2014  PATIENT NAME:      Sarah Watson, Sarah Watson           MR #:      [id number] BIRTHDATE:       [birth year]      VISIT #:     [id number] ATTENDING:     Barney Drain     STATUS:     outpatient ASSISTANT:  INDICATIONS:  The patient is a 47 yr old female here for a colonoscopy due to MICROCYTIC ANEMIA MAY 2015(Hb 8.7, MCV 79) & JUN 2015 Hb 9.5, MCV 83.3. PROCEDURE PERFORMED:     Colonoscopy, screening MEDICATIONS:     Promethazine (Phenergan) 12.5 mg IV, Meperidine (Demerol) 75 mg IV, and Versed 7 mg IV ESTIMATED BLOOD LOSS:     None  CONSENT: The patient understands the risks and benefits of the procedure and understands that these risks include, but are not limited to: sedation, allergic reaction, infection, perforation and/or bleeding. Alternative means of evaluation and treatment include, among others: physical exam, x-rays, and/or surgical intervention. The patient elects to proceed with this endoscopic procedure.  DESCRIPTION OF PROCEDURE: During intra-op preparation period all mechanical & medical equipment was checked for proper function. Hand hygiene and appropriate measures for infection prevention was taken. After the risks, benefits and alternatives of the procedure were thoroughly explained, Informed consent was verified, confirmed and timeout was successfully executed by the treatment team. A digital exam The    endoscope was introduced through the anus and advanced to the ileum. The prep was good.. The instrument was then slowly withdrawn as the colon was fully examined.  COLON FINDINGS: The examined terminal ileum appeared to be normal. The LEFT colon IS redundant.  Manual abdominal counter-pressure was used to reach the cecum.   The colon mucosa was otherwise normal. Moderate sized internal hemorrhoids were found.     The scope was then completely withdrawn  from the patient and the procedure terminated.    ADVERSE EVENTS:      There were no complications.      CECAL WITHDRAWAL TIME: 12 MINS IMPRESSIONS:     1.  NO SOURCE FOR ANEMIA IDENTIFIED 2.  The LEFT colon IS redundant 3.  The colon mucosa was otherwise normal 4.  Moderate sized internal hemorrhoids  RECOMMENDATIONS:     START TRAZODONE TO STIMULATE YOUR APPETITE.  IT CAN CAUSE DROWSINESS, SUICIDAL THOUGHTS, OR WEIRD DREAMS. DISCUSSED WITH PT REFILLS SHOULD BE PROVIDED BY PCP. FOLLOW A HIGH FIBER DIET.  AVOID ITEMS THAT CAUSE BLOATING. BIOPSY RESULTS WILL BE BACK IN 14 DAYS.IF NO SOURCE FOR ANEMIA IDENTIFIED, REPEAT CBC/?GIVENS CAPSULE. Next colonoscopy in 10 years WITH AN OVERTUBE. RECALL:  Fields, Sandi eSigned:  Barney Drain, MD 08/13/2014 12:24 PM Revised: 08/13/2014 12:24 PM  cc:  Sharilyn Sites, M.D.

## 2014-08-04 NOTE — Op Note (Signed)
[facility] [address] [place], [address]   ENDOSCOPY PROCEDURE REPORT  PATIENT: Sarah Watson, Sarah Watson  MR#: [id number] BIRTHDATE: Dec 10, 1965 , 27  yrs. old GENDER: female ENDOSCOPIST: Barney Drain, MD REFERRED BY:  Sharilyn Sites, M.D. PROCEDURE DATE:  08/03/2014 PROCEDURE:  EGD w/ biopsy ASA CLASS: INDICATIONS:  anemia. MEDICATIONS: TCS+ Meperidine (Demerol) 25 mg IV and Versed 1 mg IV  TOPICAL ANESTHETIC: Viscous Xylocaine  DESCRIPTION OF PROCEDURE: After the risks benefits and alternatives of the procedure were thoroughly explained, informed consent was obtained.  The    endoscope was introduced through the mouth and advanced to the second portion of the duodenum , limited by Without limitations. The instrument was slowly withdrawn as the mucosa was fully examined.   ESOPHAGUS: The mucosa of the esophagus appeared normal.   STOMACH: The mucosa of the stomach appeared normal.  Multiple biopsies were performed using cold forceps.   A small hiatal hernia was noted. DUODENUM: The duodenal mucosa showed no abnormalities in the bulb and 2nd part of the duodenum.  Cold forceps biopsies were taken in the bulb and second portion.         The scope was then withdrawn from the patient and the procedure completed.  COMPLICATIONS: There were no complications.  ENDOSCOPIC IMPRESSION: 1.   NO SOURCE FOR ANEMIA IDENTIFIED 2.   Small hiatal hernia  RECOMMENDATIONS: START TRAZODONE TO STIMULATE YOUR APPETITE.  IT CAN CAUSE DROWSINESS, SUICIDAL THOUGHTS, OR WEIRD DREAMS. FOLLOW A HIGH FIBER DIET.  AVOID ITEMS THAT CAUSE BLOATING. BIOPSY RESULTS WILL BE BACK IN 14 DAYS. Next colonoscopy in 10 years WITH AN OVERTUBE.  REPEAT EXAM:  eSignedBarney Drain, MD 08/04/2014 8:57 PM    CC:

## 2014-08-06 ENCOUNTER — Encounter (HOSPITAL_COMMUNITY): Payer: Self-pay | Admitting: Gastroenterology

## 2014-09-03 ENCOUNTER — Telehealth: Payer: Self-pay | Admitting: Gastroenterology

## 2014-09-03 DIAGNOSIS — D649 Anemia, unspecified: Secondary | ICD-10-CM

## 2014-09-03 NOTE — Assessment & Plan Note (Signed)
CBC

## 2014-09-03 NOTE — Telephone Encounter (Signed)
PLEASE CALL PT. HER stomach Bx shows gastritis.  HER SMALL BOWEL BIOPSIES ARE NORMAL. NO SOURCE FOR HER ANEMIA WAS IDENTIFIED. SHE NEEDS A REPEAT CBC/FERRITIN. IF HER BLOOD COUNT IS LOW, SHE WILL NEED A THE CAMERA PILL STUDY(GIVENS CAPSULE).

## 2014-09-07 NOTE — Telephone Encounter (Signed)
Routing to DS 

## 2014-09-07 NOTE — Telephone Encounter (Signed)
LMOM to call.

## 2014-09-08 NOTE — Telephone Encounter (Signed)
Pt called and is aware of results and to go to the lab for the blood work.

## 2014-09-08 NOTE — Telephone Encounter (Signed)
Letter mailed to pt to call. Lab order has been faxed to [facility].

## 2014-09-14 ENCOUNTER — Encounter (HOSPITAL_COMMUNITY): Payer: Self-pay | Admitting: Gastroenterology

## 2014-10-21 ENCOUNTER — Other Ambulatory Visit (HOSPITAL_COMMUNITY): Payer: BC Managed Care – PPO

## 2014-10-21 ENCOUNTER — Ambulatory Visit (HOSPITAL_COMMUNITY): Payer: BC Managed Care – PPO

## 2014-10-26 ENCOUNTER — Encounter (HOSPITAL_COMMUNITY): Payer: Self-pay

## 2015-04-26 ENCOUNTER — Telehealth: Payer: Self-pay | Admitting: Internal Medicine

## 2015-04-26 NOTE — Telephone Encounter (Signed)
New patient apt- S/w Emersen Mascari and confirmed appt for 06/14 @ 11 w/Dr. Julien Nordmann Referring Dr. Estanislado Pandy Dx- ABN weight loss severe fatigue

## 2015-04-26 NOTE — Telephone Encounter (Signed)
Patient confirmed np appt for 06/14 @ 11 w/Dr. Julien Nordmann

## 2015-04-27 ENCOUNTER — Telehealth: Payer: Self-pay | Admitting: Internal Medicine

## 2015-04-27 ENCOUNTER — Other Ambulatory Visit: Payer: BC Managed Care – PPO

## 2015-04-27 ENCOUNTER — Ambulatory Visit (HOSPITAL_BASED_OUTPATIENT_CLINIC_OR_DEPARTMENT_OTHER): Payer: BC Managed Care – PPO

## 2015-04-27 ENCOUNTER — Ambulatory Visit (HOSPITAL_BASED_OUTPATIENT_CLINIC_OR_DEPARTMENT_OTHER): Payer: BC Managed Care – PPO | Admitting: Internal Medicine

## 2015-04-27 ENCOUNTER — Encounter: Payer: Self-pay | Admitting: Internal Medicine

## 2015-04-27 ENCOUNTER — Other Ambulatory Visit: Payer: Self-pay | Admitting: Internal Medicine

## 2015-04-27 VITALS — BP 109/70 | HR 89 | Temp 98.5°F | Resp 19 | Ht 63.0 in | Wt 124.5 lb

## 2015-04-27 DIAGNOSIS — R634 Abnormal weight loss: Secondary | ICD-10-CM

## 2015-04-27 DIAGNOSIS — D649 Anemia, unspecified: Secondary | ICD-10-CM

## 2015-04-27 DIAGNOSIS — D640 Hereditary sideroblastic anemia: Secondary | ICD-10-CM

## 2015-04-27 LAB — CBC & DIFF AND RETIC
BASO%: 0.7 % (ref 0.0–2.0)
Basophils Absolute: 0.1 10*3/uL (ref 0.0–0.1)
EOS ABS: 0.1 10*3/uL (ref 0.0–0.5)
EOS%: 0.6 % (ref 0.0–7.0)
HCT: 29.4 % — ABNORMAL LOW (ref 34.8–46.6)
HGB: 9.4 g/dL — ABNORMAL LOW (ref 11.6–15.9)
IMMATURE RETIC FRACT: 6.5 % (ref 1.60–10.00)
LYMPH#: 1.8 10*3/uL (ref 0.9–3.3)
LYMPH%: 20.1 % (ref 14.0–49.7)
MCH: 29.5 pg (ref 25.1–34.0)
MCHC: 32 g/dL (ref 31.5–36.0)
MCV: 92.2 fL (ref 79.5–101.0)
MONO#: 0.6 10*3/uL (ref 0.1–0.9)
MONO%: 6.7 % (ref 0.0–14.0)
NEUT%: 71.9 % (ref 38.4–76.8)
NEUTROS ABS: 6.5 10*3/uL (ref 1.5–6.5)
Platelets: 472 10*3/uL — ABNORMAL HIGH (ref 145–400)
RBC: 3.19 10*6/uL — AB (ref 3.70–5.45)
RDW: 13.7 % (ref 11.2–14.5)
RETIC CT ABS: 37.32 10*3/uL (ref 33.70–90.70)
Retic %: 1.17 % (ref 0.70–2.10)
WBC: 9.1 10*3/uL (ref 3.9–10.3)

## 2015-04-27 LAB — IRON AND TIBC CHCC
%SAT: 18 % — ABNORMAL LOW (ref 21–57)
Iron: 34 ug/dL — ABNORMAL LOW (ref 41–142)
TIBC: 194 ug/dL — ABNORMAL LOW (ref 236–444)
UIBC: 160 ug/dL (ref 120–384)

## 2015-04-27 LAB — LACTATE DEHYDROGENASE (CC13): LDH: 162 U/L (ref 125–245)

## 2015-04-27 LAB — COMPREHENSIVE METABOLIC PANEL (CC13)
ALK PHOS: 82 U/L (ref 40–150)
ALT: 13 U/L (ref 0–55)
ANION GAP: 10 meq/L (ref 3–11)
AST: 15 U/L (ref 5–34)
Albumin: 2.8 g/dL — ABNORMAL LOW (ref 3.5–5.0)
BILIRUBIN TOTAL: 0.31 mg/dL (ref 0.20–1.20)
BUN: 12.3 mg/dL (ref 7.0–26.0)
CO2: 25 meq/L (ref 22–29)
Calcium: 8.7 mg/dL (ref 8.4–10.4)
Chloride: 105 mEq/L (ref 98–109)
Creatinine: 1.2 mg/dL — ABNORMAL HIGH (ref 0.6–1.1)
EGFR: 59 mL/min/{1.73_m2} — ABNORMAL LOW (ref >90)
Glucose: 84 mg/dl (ref 70–140)
Potassium: 4.6 mEq/L (ref 3.5–5.1)
SODIUM: 140 meq/L (ref 136–145)
Total Protein: 7.4 g/dL (ref 6.4–8.3)

## 2015-04-27 LAB — FERRITIN CHCC: Ferritin: 291 ng/ml — ABNORMAL HIGH (ref 9–269)

## 2015-04-27 NOTE — Progress Notes (Signed)
Checked in new pt with no financial concerns prior to seeing the dr.  Pt has 2 insurances so financial assistance may not be needed.  ° °

## 2015-04-27 NOTE — Progress Notes (Signed)
[facility] [phone]   [phone]  CONSULT NOTE  REFERRING PHYSICIAN: Dr. Pollyann Savoy  REASON FOR CONSULTATION:  49 years old African-American female for evaluation of persistent anemia and weight loss.  HPI Sarah Watson is a 49 y.o. female with past medical history significant for gestational diabetes mellitus, ectopic pregnancy as well as recent significant weight loss around 40 pounds in the last 5 months in addition to iron deficiency anemia. She was seen by her primary care physician Dr. Phillips Odor for evaluation of her persistent weight loss. Blood work at that time were suspicious for lupus. Patient was referred to Dr. Corliss Skains for evaluation of her condition. She had several other blood tests performed including serum protein electrophoreses with quantitative immunoglobulin as well as CBCs it showed low hemoglobin of 11.4 and hematocrit 34.9% with elevated platelets count of 500. IgG was elevated at 2060 and IgA 527. ESR was 120 and serum creatinine 1.24. She has a positive ANA but the NA titer was 1:40. The patient was referred to me today for evaluation and recommendation regarding these abnormalities in her blood work. She was seen last year by Dr. Daneen Schick at [facility] for evaluation of her anemia and she had several studies performed at that time including iron study that showed evidence for iron deficiency. JAK 2 mutation was negative.  When seen today the patient continues to complain of fatigue and she lost around 40 pounds in the last 5 months. She also has lack of appetite and dryness of her eyes. She had increased urine frequency recently. She was diagnosed with urinary tract infection by Dr. Corliss Skains. She denied having any significant nausea or vomiting, no fever or chills. She denied having any significant chest pain, shortness of breath, cough or hemoptysis. Family history significant for a father with bipolar disorder and  atrial fibrillation, mother has dementia and diabetes mellitus, maternal grandmother had leukemia and paternal grandfather had 3 cancer including leukemia, bone cancer and lung cancer. The patient is married and has 2 children. She is a Physicist, medical. She has no history of smoking, alcohol or drug abuse.  HPI  Past Medical History  Diagnosis Date  . NSVD (normal spontaneous vaginal delivery)   . Ectopic pregnancy   . Gestational diabetes   . Oligomenorrhea   . Eczema   . Anemia     Past Surgical History  Procedure Laterality Date  . Pelvic laparoscopy      RIGHT SALPINGECTOMY  . Tubal ligation    . Neuroma surgery  2008    REMOVAL OF LEFT EAR ACOUSTIC NEUROMA  . Colonoscopy N/A 08/03/2014    Procedure: COLONOSCOPY;  Surgeon: West Bali, MD;  Location: [facility];  Service: Endoscopy;  Laterality: N/A;  930  . Esophagogastroduodenoscopy N/A 08/03/2014    Procedure: ESOPHAGOGASTRODUODENOSCOPY (EGD);  Surgeon: West Bali, MD;  Location: [facility];  Service: Endoscopy;  Laterality: N/A;    Family History  Problem Relation Age of Onset  . Diabetes Paternal Aunt   . Cancer Paternal Grandfather     PROSTATE  . Colon cancer Neg Hx     Social History History  Substance Use Topics  . Smoking status: Never Smoker   . Smokeless tobacco: Never Used     Comment: Never smoked  . Alcohol Use: No    Allergies  Allergen Reactions  . Penicillins Anaphylaxis    Current Outpatient Prescriptions  Medication Sig Dispense Refill  . cholecalciferol (VITAMIN  D) 1000 UNITS tablet Take 1,000 Units by mouth daily.    . ferrous sulfate 325 (65 FE) MG tablet Take 325 mg by mouth 3 (three) times daily.  11  . hydroxychloroquine (PLAQUENIL) 200 MG tablet Take 200 mg by mouth 2 (two) times daily.  2  . Multiple Vitamins-Minerals (MULTIVITAMIN PO) Take 1 tablet by mouth daily. States she takes this when she remembers it    . predniSONE (DELTASONE) 5 MG tablet Take 5 mg by  mouth daily.  2   No current facility-administered medications for this visit.    Review of Systems  Constitutional: positive for fatigue and weight loss Eyes: negative Ears, nose, mouth, throat, and face: positive for Dryness of her eye Respiratory: negative Cardiovascular: negative Gastrointestinal: negative Genitourinary:negative Integument/breast: negative Hematologic/lymphatic: negative Musculoskeletal:negative Neurological: negative Behavioral/Psych: negative Endocrine: negative Allergic/Immunologic: negative  Physical Exam  VOZ:DGUYQ, healthy, no distress, malnourished and pale SKIN: skin color, texture, turgor are normal, no rashes or significant lesions HEAD: Normocephalic, No masses, lesions, tenderness or abnormalities EYES: normal, PERRLA, Conjunctiva are pink and non-injected EARS: External ears normal, Canals clear OROPHARYNX:no exudate, no erythema and lips, buccal mucosa, and tongue normal  NECK: supple, no adenopathy, no JVD LYMPH:  no palpable lymphadenopathy, no hepatosplenomegaly BREAST:not examined LUNGS: clear to auscultation , and palpation HEART: regular rate & rhythm, no murmurs and no gallops ABDOMEN:abdomen soft, non-tender, normal bowel sounds and no masses or organomegaly BACK: Back symmetric, no curvature., No CVA tenderness EXTREMITIES:no joint deformities, effusion, or inflammation, no edema, no skin discoloration  NEURO: alert & oriented x 3 with fluent speech, no focal motor/sensory deficits  PERFORMANCE STATUS: ECOG 1  LABORATORY DATA: Lab Results  Component Value Date   WBC 9.1 04/27/2015   HGB 9.4* 04/27/2015   HCT 29.4* 04/27/2015   MCV 92.2 04/27/2015   PLT 472* 04/27/2015      Chemistry      Component Value Date/Time   NA 136* 03/24/2014 1602   K 4.5 03/24/2014 1602   CL 99 03/24/2014 1602   CO2 27 03/24/2014 1602   BUN 18 03/24/2014 1602   CREATININE 1.07 03/24/2014 1602      Component Value Date/Time   CALCIUM  9.2 03/24/2014 1602   ALKPHOS 82 03/24/2014 1602   AST 17 03/24/2014 1602   ALT 10 03/24/2014 1602   BILITOT 0.2* 03/24/2014 1602       RADIOGRAPHIC STUDIES: No results found.  ASSESSMENT: This is a very pleasant 49 years old African-American female with persistent anemia likely secondary to iron deficiency versus anemia of chronic disease as well as significant weight loss of unknown etiology.   PLAN: I had a lengthy discussion with the patient and her sister today about her current disease condition and further investigation to confirm a diagnosis. She had several studies performed in the past including hemoglobin electrophoresis that showed normal pattern. I ordered several studies today for evaluation of her anemia including repeat CBC, comprehensive metabolic panel, LDH, serum erythropoietin, blood his a metals, serum folate, serum vitamin B 12, and serum protein electrophoreses. The patient had TSH performed last year that was unremarkable. If no clear evidence for her anemia or explanation for the significant weight loss, I may consider the patient for a bone marrow biopsy and aspirate in addition to imaging studies including CT scan of the chest, abdomen and pelvis to rule out any occult malignancy. The patient would come back for follow-up visit in one month for reevaluation with repeat CBC and TSH  She was advised to call immediately if she has any concerning symptoms in the interval. The patient voices understanding of current disease status and treatment options and is in agreement with the current care plan.  All questions were answered. The patient knows to call the clinic with any problems, questions or concerns. We can certainly see the patient much sooner if necessary.  Thank you so much for allowing me to participate in the care of Sarah Watson. I will continue to follow up the patient with you and assist in her care.  I spent 30 minutes counseling the patient face to face.  The total time spent in the appointment was 45 minutes.  Disclaimer: This note was dictated with voice recognition software. Similar sounding words can inadvertently be transcribed and may not be corrected upon review.   Sarah Victoria K. April 27, 2015, 1:32 PM

## 2015-04-27 NOTE — Telephone Encounter (Signed)
lvm for pt regarding to JUly appt....mailed pt appt sched/avs and letter

## 2015-04-29 LAB — IFE INTERPRETATION

## 2015-04-29 LAB — HEAVY METALS, BLOOD
Arsenic: <3 mcg/L (ref <23)
Lead: <2 ug/dL (ref <10)
Mercury, B: <4 mcg/L (ref <=10)

## 2015-04-29 LAB — PROTEIN ELECTROPHORESIS, SERUM, WITH REFLEX
ALPHA-1-GLOBULIN: 0.6 g/dL — AB (ref 0.2–0.3)
ALPHA-2-GLOBULIN: 0.9 g/dL (ref 0.5–0.9)
Albumin ELP: 3.1 g/dL — ABNORMAL LOW (ref 3.8–4.8)
BETA 2: 0.6 g/dL — AB (ref 0.2–0.5)
BETA GLOBULIN: 0.5 g/dL (ref 0.4–0.6)
GAMMA GLOBULIN: 1.9 g/dL — AB (ref 0.8–1.7)
Total Protein, Serum Electrophoresis: 7.4 g/dL (ref 6.1–8.1)

## 2015-04-29 LAB — IGG, IGA, IGM
IGG (IMMUNOGLOBIN G), SERUM: 1900 mg/dL — AB (ref 690–1700)
IgA: 511 mg/dL — ABNORMAL HIGH (ref 69–380)
IgM, Serum: 184 mg/dL (ref 52–322)

## 2015-04-29 LAB — ERYTHROPOIETIN: ERYTHROPOIETIN: 19.6 m[IU]/mL — AB (ref 2.6–18.5)

## 2015-04-29 LAB — FOLATE: FOLATE: 17.1 ng/mL

## 2015-04-29 LAB — VITAMIN B12: Vitamin B-12: 1072 pg/mL — ABNORMAL HIGH (ref 211–911)

## 2015-05-27 ENCOUNTER — Telehealth: Payer: Self-pay | Admitting: Internal Medicine

## 2015-05-27 ENCOUNTER — Ambulatory Visit (HOSPITAL_BASED_OUTPATIENT_CLINIC_OR_DEPARTMENT_OTHER): Payer: BC Managed Care – PPO | Admitting: Internal Medicine

## 2015-05-27 ENCOUNTER — Encounter: Payer: Self-pay | Admitting: Internal Medicine

## 2015-05-27 ENCOUNTER — Other Ambulatory Visit (HOSPITAL_BASED_OUTPATIENT_CLINIC_OR_DEPARTMENT_OTHER): Payer: BC Managed Care – PPO

## 2015-05-27 VITALS — BP 104/55 | HR 95 | Temp 99.4°F | Resp 18 | Ht 63.0 in | Wt 123.0 lb

## 2015-05-27 DIAGNOSIS — R630 Anorexia: Secondary | ICD-10-CM | POA: Diagnosis not present

## 2015-05-27 DIAGNOSIS — D509 Iron deficiency anemia, unspecified: Secondary | ICD-10-CM | POA: Insufficient documentation

## 2015-05-27 DIAGNOSIS — R5383 Other fatigue: Secondary | ICD-10-CM

## 2015-05-27 DIAGNOSIS — D649 Anemia, unspecified: Secondary | ICD-10-CM

## 2015-05-27 DIAGNOSIS — R634 Abnormal weight loss: Secondary | ICD-10-CM | POA: Diagnosis not present

## 2015-05-27 LAB — CBC WITH DIFFERENTIAL/PLATELET
BASO%: 0.6 % (ref 0.0–2.0)
BASOS ABS: 0.1 10*3/uL (ref 0.0–0.1)
EOS ABS: 0 10*3/uL (ref 0.0–0.5)
EOS%: 0.4 % (ref 0.0–7.0)
HEMATOCRIT: 31.1 % — AB (ref 34.8–46.6)
HEMOGLOBIN: 10 g/dL — AB (ref 11.6–15.9)
LYMPH%: 12.8 % — ABNORMAL LOW (ref 14.0–49.7)
MCH: 28.8 pg (ref 25.1–34.0)
MCHC: 32.3 g/dL (ref 31.5–36.0)
MCV: 89.3 fL (ref 79.5–101.0)
MONO#: 0.9 10*3/uL (ref 0.1–0.9)
MONO%: 7.8 % (ref 0.0–14.0)
NEUT%: 78.4 % — ABNORMAL HIGH (ref 38.4–76.8)
NEUTROS ABS: 8.8 10*3/uL — AB (ref 1.5–6.5)
Platelets: 521 10*3/uL — ABNORMAL HIGH (ref 145–400)
RBC: 3.48 10*6/uL — ABNORMAL LOW (ref 3.70–5.45)
RDW: 14 % (ref 11.2–14.5)
WBC: 11.2 10*3/uL — ABNORMAL HIGH (ref 3.9–10.3)
lymph#: 1.4 10*3/uL (ref 0.9–3.3)

## 2015-05-27 LAB — TSH CHCC: TSH: 1.173 m[IU]/L (ref 0.308–3.960)

## 2015-05-27 MED ORDER — MIRTAZAPINE 30 MG PO TABS: 30.0000 mg | ORAL_TABLET | Freq: Every day | ORAL | Status: DC

## 2015-05-27 NOTE — Progress Notes (Signed)
[place] [phone]   Fax:(336) [phone]  OFFICE PROGRESS NOTE  Purvis Kilts, MD [place] [id number]  DIAGNOSIS: Persistent anemia questionable for anemia of chronic disease plus/minus iron deficiency  PRIOR THERAPY: None  CURRENT THERAPY: Ferrous sulfate 325 mg by mouth 1-2 tablets daily.  INTERVAL HISTORY: Sarah Watson 49 y.o. female returns to the clinic today for follow-up visit accompanied by her sister. The patient is feeling fine today except for the persistent fatigue as well as weight loss. She denied having any dizzy spells. She has no significant chest pain, shortness of breath, cough or hemoptysis. She has no nausea or vomiting, no fever or chills. She has been under a lot of status from her job with some early signs of depression. She had several studies performed recently for evaluation of her persistent anemia. This including serum erythropoietin that was 19.6, blood test for heavy metals was unremarkable, serum folate 17.1, vitamin B 12 was 1072, serum protein electrophoreses was unremarkable. Serum iron was low at 34, total iron binding capacity 194, iron saturation 18%. The patient is here today for evaluation and discussion of her treatment options.  MEDICAL HISTORY: Past Medical History  Diagnosis Date  . NSVD (normal spontaneous vaginal delivery)   . Ectopic pregnancy   . Gestational diabetes   . Oligomenorrhea   . Eczema   . Anemia     ALLERGIES:  is allergic to penicillins.  MEDICATIONS:  Current Outpatient Prescriptions  Medication Sig Dispense Refill  . cholecalciferol (VITAMIN D) 1000 UNITS tablet Take 1,000 Units by mouth daily.    . ferrous sulfate 325 (65 FE) MG tablet Take 325 mg by mouth 3 (three) times daily.  11  . hydroxychloroquine (PLAQUENIL) 200 MG tablet Take 200 mg by mouth 2 (two) times daily.  2  . Multiple Vitamins-Minerals (MULTIVITAMIN PO) Take 1 tablet by mouth daily.  States she takes this when she remembers it    . predniSONE (DELTASONE) 5 MG tablet Take 5 mg by mouth daily.  2   No current facility-administered medications for this visit.    SURGICAL HISTORY:  Past Surgical History  Procedure Laterality Date  . Pelvic laparoscopy      RIGHT SALPINGECTOMY  . Tubal ligation    . Neuroma surgery  2008    REMOVAL OF LEFT EAR ACOUSTIC NEUROMA  . Colonoscopy N/A 08/03/2014    Procedure: COLONOSCOPY;  Surgeon: Danie Binder, MD;  Location: [facility];  Service: Endoscopy;  Laterality: N/A;  930  . Esophagogastroduodenoscopy N/A 08/03/2014    Procedure: ESOPHAGOGASTRODUODENOSCOPY (EGD);  Surgeon: Danie Binder, MD;  Location: [facility];  Service: Endoscopy;  Laterality: N/A;    REVIEW OF SYSTEMS:  Constitutional: positive for fatigue and weight loss Eyes: negative Ears, nose, mouth, throat, and face: negative Respiratory: negative Cardiovascular: negative Gastrointestinal: negative Genitourinary:negative Integument/breast: negative Hematologic/lymphatic: negative Musculoskeletal:negative Neurological: negative Behavioral/Psych: negative Endocrine: negative Allergic/Immunologic: negative   PHYSICAL EXAMINATION: General appearance: alert, cooperative, fatigued and no distress Head: Normocephalic, without obvious abnormality, atraumatic Neck: no adenopathy, no JVD, supple, symmetrical, trachea midline and thyroid not enlarged, symmetric, no tenderness/mass/nodules Lymph nodes: Cervical, supraclavicular, and axillary nodes normal. Resp: clear to auscultation bilaterally Back: symmetric, no curvature. ROM normal. No CVA tenderness. Cardio: regular rate and rhythm, S1, S2 normal, no murmur, click, rub or gallop GI: soft, non-tender; bowel sounds normal; no masses,  no organomegaly Extremities: extremities normal, atraumatic, no cyanosis or edema Neurologic:  Alert and oriented X 3, normal strength and tone. Normal symmetric reflexes. Normal  coordination and gait  ECOG PERFORMANCE STATUS: 1 - Symptomatic but completely ambulatory  Blood pressure 104/55, pulse 95, temperature 99.4 F (37.4 C), temperature source Oral, resp. rate 18, height 5\' 3"  (1.6 m), weight 123 lb (55.792 kg), last menstrual period 11/20/2013, SpO2 100 %.  LABORATORY DATA: Lab Results  Component Value Date   WBC 11.2* 05/27/2015   HGB 10.0* 05/27/2015   HCT 31.1* 05/27/2015   MCV 89.3 05/27/2015   PLT 521* 05/27/2015      Chemistry      Component Value Date/Time   NA 140 04/27/2015 1300   NA 136* 03/24/2014 1602   K 4.6 04/27/2015 1300   K 4.5 03/24/2014 1602   CL 99 03/24/2014 1602   CO2 25 04/27/2015 1300   CO2 27 03/24/2014 1602   BUN 12.3 04/27/2015 1300   BUN 18 03/24/2014 1602   CREATININE 1.2* 04/27/2015 1300   CREATININE 1.07 03/24/2014 1602      Component Value Date/Time   CALCIUM 8.7 04/27/2015 1300   CALCIUM 9.2 03/24/2014 1602   ALKPHOS 82 04/27/2015 1300   ALKPHOS 82 03/24/2014 1602   AST 15 04/27/2015 1300   AST 17 03/24/2014 1602   ALT 13 04/27/2015 1300   ALT 10 03/24/2014 1602   BILITOT 0.31 04/27/2015 1300   BILITOT 0.2* 03/24/2014 1602       RADIOGRAPHIC STUDIES: No results found.  ASSESSMENT AND PLAN: This is a very pleasant 49 years old female with persistent anemia likely secondary to iron deficiency in addition to anemia of chronic disease. The patient also has persistent weight loss secondary to lack of appetite and depression. I had a lengthy discussion with the patient today about her condition and treatment options. She is currently on oral iron tablets but not effective. I recommended for the patient to consider Feraheme infusion 510 MG IV weekly 2 doses. First dose expected on 06/04/2015. For the depression, I will start the patient on Remeron 30 mg by mouth daily at bedtime. The patient would come back for follow-up visit in 3 months for reevaluation with repeat CBC, iron study and ferritin. She  was advised to call immediately if she has any concerning symptoms in the interval. The patient voices understanding of current disease status and treatment options and is in agreement with the current care plan.  All questions were answered. The patient knows to call the clinic with any problems, questions or concerns. We can certainly see the patient much sooner if necessary.  I spent 15 minutes counseling the patient face to face. The total time spent in the appointment was 25 minutes.  Disclaimer: This note was dictated with voice recognition software. Similar sounding words can inadvertently be transcribed and may not be corrected upon review.

## 2015-05-27 NOTE — Telephone Encounter (Signed)
per pof to sch pt appt-gave pt copy of avs-Shameeka sch pt fera

## 2015-06-03 ENCOUNTER — Other Ambulatory Visit: Payer: Self-pay | Admitting: Hematology

## 2015-06-04 ENCOUNTER — Ambulatory Visit (HOSPITAL_BASED_OUTPATIENT_CLINIC_OR_DEPARTMENT_OTHER): Payer: BC Managed Care – PPO

## 2015-06-04 VITALS — BP 96/49 | HR 86 | Temp 99.7°F | Resp 17

## 2015-06-04 DIAGNOSIS — D649 Anemia, unspecified: Secondary | ICD-10-CM | POA: Diagnosis not present

## 2015-06-04 DIAGNOSIS — D509 Iron deficiency anemia, unspecified: Secondary | ICD-10-CM

## 2015-06-04 MED ORDER — SODIUM CHLORIDE 0.9 % IV SOLN
Freq: Once | INTRAVENOUS | Status: AC
  Administered 2015-06-04: 12:00:00 via INTRAVENOUS

## 2015-06-04 MED ORDER — SODIUM CHLORIDE 0.9 % IV SOLN
510.0000 mg | Freq: Once | INTRAVENOUS | Status: AC
  Administered 2015-06-04: 510 mg via INTRAVENOUS
  Filled 2015-06-04: qty 17

## 2015-06-04 NOTE — Patient Instructions (Addendum)

## 2015-06-04 NOTE — Progress Notes (Signed)
13:30 - Pt tolerated first time feraheme with no complications.  Observed for 30 minutes post infusion and no side effects or complaints.  Upon obtaining vital signs, temperature found to be elevated from arrival temp of 98.7 to 100.3.  Temperature rechecked and found to be 101.8.  Pt reported her temperature runs high and she is normally around 100 but given first time infusion and tmax of 101.8 I called on call physician.  Upon waiting for response, rechecked temp and found to be down to 99.7.  Dr. Alen Blew stated it was okay to discharge pt.  Pt educated on taking her temperature and if she noticed a continued elevation different than her baseline to call us.  Pt verbalized understanding and had no questions or concerns at time of discharge.

## 2015-06-11 ENCOUNTER — Ambulatory Visit (HOSPITAL_BASED_OUTPATIENT_CLINIC_OR_DEPARTMENT_OTHER): Payer: BC Managed Care – PPO

## 2015-06-11 VITALS — BP 92/52 | HR 83 | Temp 98.7°F | Resp 16

## 2015-06-11 DIAGNOSIS — D649 Anemia, unspecified: Secondary | ICD-10-CM | POA: Diagnosis not present

## 2015-06-11 DIAGNOSIS — D509 Iron deficiency anemia, unspecified: Secondary | ICD-10-CM

## 2015-06-11 MED ORDER — SODIUM CHLORIDE 0.9 % IV SOLN
Freq: Once | INTRAVENOUS | Status: AC
  Administered 2015-06-11: 10:00:00 via INTRAVENOUS

## 2015-06-11 MED ORDER — SODIUM CHLORIDE 0.9 % IV SOLN
510.0000 mg | Freq: Once | INTRAVENOUS | Status: AC
  Administered 2015-06-11: 510 mg via INTRAVENOUS
  Filled 2015-06-11: qty 17

## 2015-06-11 NOTE — Progress Notes (Signed)
Pt states her nausea is less since Wednesday. Had diarrhea x3 yesterday, took immodium with relief of symptoms. No diarrhea today. Overall pt states she feels better.

## 2015-06-11 NOTE — Patient Instructions (Signed)

## 2015-08-24 ENCOUNTER — Ambulatory Visit: Payer: BC Managed Care – PPO | Admitting: Internal Medicine

## 2015-08-30 ENCOUNTER — Encounter: Payer: Self-pay | Admitting: Medical Oncology

## 2015-08-31 ENCOUNTER — Ambulatory Visit: Payer: BC Managed Care – PPO | Admitting: Internal Medicine

## 2016-01-07 ENCOUNTER — Encounter: Payer: Self-pay | Admitting: Internal Medicine

## 2016-01-07 ENCOUNTER — Telehealth: Payer: Self-pay | Admitting: Internal Medicine

## 2016-01-07 NOTE — Progress Notes (Signed)
patient bought forms in.I will call and fax once done

## 2016-01-07 NOTE — Telephone Encounter (Signed)
Pt came in to see about getting scheduled and see a Nutritionist, scheduled pt labs/ov and she can discussed with MD at her visit. Pt was not scheduled for her 3 month f/u.... KJ pt confirmed labs/ov

## 2016-01-11 ENCOUNTER — Telehealth: Payer: Self-pay | Admitting: Medical Oncology

## 2016-01-11 ENCOUNTER — Encounter: Payer: Self-pay | Admitting: Internal Medicine

## 2016-01-11 NOTE — Telephone Encounter (Signed)
STD forms received and pt requests to be out of work on march 6th. I tried to call pt as to why she wants to be out of work and no answer. No voice mail set up on mobile number.

## 2016-01-11 NOTE — Progress Notes (Signed)
placed for dr. to sign

## 2016-01-12 ENCOUNTER — Encounter: Payer: Self-pay | Admitting: Internal Medicine

## 2016-01-12 NOTE — Progress Notes (Signed)
Called to let the patient message that dr Julien Nordmann said the std forms will have to be filled out by her pcp. I let her know they are at front with ms. wilma

## 2016-01-17 ENCOUNTER — Telehealth: Payer: Self-pay | Admitting: Internal Medicine

## 2016-01-17 NOTE — Telephone Encounter (Signed)
Message below sent to MM via staff message.    Hi Dr. Julien Nordmann,  Sarah Watson stopped by today and states she was seen by her pcp, Sarah Watson on Friday and told to come here today for an iron injection.  Sarah Watson missed her lab/fu October 2016. Last week the  appointments were rescheduled at her request and she was given your next available which is 02/08/16. Please advise regarding her request for iron. I confirmed her appointment for lab/fu 3/28 and informed her she would be contacted re the iron as she could not wait due to she had her children with her who were waiting outside.  She did not remember Dr. Marisue Brooklyn first name.   Thanks, [employer]

## 2016-01-18 ENCOUNTER — Telehealth: Payer: Self-pay | Admitting: *Deleted

## 2016-01-18 NOTE — Telephone Encounter (Signed)
-----   Message from Curt Bears, MD sent at 01/17/2016  7:59 PM EST ----- Regarding: RE: PATIENT REQUESTING IRON INFUSION.  Unfortunately, I do not have any opening before 3/28. I will check her lab at that time and make a decision regarding needs for iron infusion. Thank you. ----- Message -----    From: Theodosia Quay    Sent: 01/17/2016   9:49 AM      To: Berniece Salines, RN, # Subject: PATIENT REQUESTING IRON INFUSION.              Hi Dr. Julien Nordmann,  Ms. Harpenau stopped by today and states she was seen by her pcp, Dr. Owens Shark on Friday and told to come here today for an iron injection.  Ms. Buehrer missed her lab/fu October 2016. Last week the  appointments were rescheduled at her request and she was given your next available which is 02/08/16. Please advise regarding her request for iron. I confirmed her appointment for lab/fu 3/28 and informed her she would be contacted re the iron as she could not wait due to she had her children with her who were waiting outside.  She did not remember Dr. Marisue Brooklyn first name.   Thanks, [employer]

## 2016-01-18 NOTE — Telephone Encounter (Signed)
Returned call to pt to advise MD will discuss infusion at her next visit 3/28. Unable to reach pt. LMOVM with above information, request pt call office is she is unable to make it to appt.  Pt previously no show 08/24/2015 and 08/31/2015.

## 2016-01-25 ENCOUNTER — Telehealth (HOSPITAL_COMMUNITY): Payer: Self-pay

## 2016-02-04 ENCOUNTER — Telehealth: Payer: Self-pay | Admitting: Internal Medicine

## 2016-02-04 ENCOUNTER — Encounter (HOSPITAL_COMMUNITY)
Admission: RE | Admit: 2016-02-04 | Discharge: 2016-02-04 | Disposition: A | Discharge status: Home / Self Care | Payer: BC Managed Care – PPO | Source: Ambulatory Visit | Attending: Physician Assistant | Admitting: Physician Assistant

## 2016-02-04 DIAGNOSIS — D509 Iron deficiency anemia, unspecified: Secondary | ICD-10-CM | POA: Diagnosis not present

## 2016-02-04 MED ORDER — SODIUM CHLORIDE 0.9 % IV SOLN
INTRAVENOUS | Status: DC
  Administered 2016-02-04: 250 mL via INTRAVENOUS

## 2016-02-04 MED ORDER — SODIUM CHLORIDE 0.9 % IV SOLN
510.0000 mg | INTRAVENOUS | Status: DC
  Administered 2016-02-04: 510 mg via INTRAVENOUS
  Filled 2016-02-04: qty 17

## 2016-02-04 NOTE — Telephone Encounter (Signed)
pt called to r/s appt visit to after iron at [facility]....done pt ok and aware of new d.t

## 2016-02-08 ENCOUNTER — Ambulatory Visit: Payer: BC Managed Care – PPO | Admitting: Internal Medicine

## 2016-02-08 ENCOUNTER — Other Ambulatory Visit: Payer: BC Managed Care – PPO

## 2016-02-11 ENCOUNTER — Encounter (HOSPITAL_COMMUNITY)
Admission: RE | Admit: 2016-02-11 | Discharge: 2016-02-11 | Disposition: A | Discharge status: Home / Self Care | Payer: BC Managed Care – PPO | Source: Ambulatory Visit | Attending: Physician Assistant | Admitting: Physician Assistant

## 2016-02-11 DIAGNOSIS — D509 Iron deficiency anemia, unspecified: Secondary | ICD-10-CM | POA: Diagnosis not present

## 2016-02-11 MED ORDER — SODIUM CHLORIDE 0.9 % IV SOLN
Freq: Once | INTRAVENOUS | Status: AC
  Administered 2016-02-11: 11:00:00 via INTRAVENOUS

## 2016-02-11 MED ORDER — SODIUM CHLORIDE 0.9 % IV SOLN
510.0000 mg | Freq: Once | INTRAVENOUS | Status: AC
  Administered 2016-02-11: 510 mg via INTRAVENOUS
  Filled 2016-02-11: qty 17

## 2016-02-14 ENCOUNTER — Other Ambulatory Visit: Payer: Self-pay | Admitting: *Deleted

## 2016-02-14 DIAGNOSIS — D509 Iron deficiency anemia, unspecified: Secondary | ICD-10-CM

## 2016-02-15 ENCOUNTER — Other Ambulatory Visit (HOSPITAL_BASED_OUTPATIENT_CLINIC_OR_DEPARTMENT_OTHER): Payer: BC Managed Care – PPO

## 2016-02-15 ENCOUNTER — Ambulatory Visit (HOSPITAL_BASED_OUTPATIENT_CLINIC_OR_DEPARTMENT_OTHER): Payer: BC Managed Care – PPO | Admitting: Internal Medicine

## 2016-02-15 ENCOUNTER — Encounter: Payer: Self-pay | Admitting: Internal Medicine

## 2016-02-15 ENCOUNTER — Telehealth: Payer: Self-pay | Admitting: Internal Medicine

## 2016-02-15 VITALS — BP 107/63 | HR 110 | Temp 101.0°F | Resp 18 | Ht 63.0 in | Wt 111.6 lb

## 2016-02-15 DIAGNOSIS — R634 Abnormal weight loss: Secondary | ICD-10-CM

## 2016-02-15 DIAGNOSIS — D509 Iron deficiency anemia, unspecified: Secondary | ICD-10-CM

## 2016-02-15 DIAGNOSIS — D649 Anemia, unspecified: Secondary | ICD-10-CM | POA: Diagnosis not present

## 2016-02-15 LAB — CBC WITH DIFFERENTIAL/PLATELET
BASO%: 0.7 % (ref 0.0–2.0)
Basophils Absolute: 0.1 10*3/uL (ref 0.0–0.1)
EOS ABS: 0.1 10*3/uL (ref 0.0–0.5)
EOS%: 0.7 % (ref 0.0–7.0)
HCT: 30.2 % — ABNORMAL LOW (ref 34.8–46.6)
HEMOGLOBIN: 9.6 g/dL — AB (ref 11.6–15.9)
LYMPH%: 10.7 % — ABNORMAL LOW (ref 14.0–49.7)
MCH: 28.3 pg (ref 25.1–34.0)
MCHC: 31.8 g/dL (ref 31.5–36.0)
MCV: 88.9 fL (ref 79.5–101.0)
MONO#: 0.8 10*3/uL (ref 0.1–0.9)
MONO%: 6.6 % (ref 0.0–14.0)
NEUT%: 81.3 % — ABNORMAL HIGH (ref 38.4–76.8)
NEUTROS ABS: 10.4 10*3/uL — AB (ref 1.5–6.5)
PLATELETS: 628 10*3/uL — AB (ref 145–400)
RBC: 3.4 10*6/uL — ABNORMAL LOW (ref 3.70–5.45)
RDW: 14.4 % (ref 11.2–14.5)
WBC: 12.8 10*3/uL — AB (ref 3.9–10.3)
lymph#: 1.4 10*3/uL (ref 0.9–3.3)

## 2016-02-15 NOTE — Progress Notes (Signed)
[place] Telephone:(336) [phone]   Fax:(336) [phone]  OFFICE PROGRESS NOTE  Purvis Kilts, MD [place] [id number]  DIAGNOSIS: Persistent anemia questionable for anemia of chronic disease plus/minus iron deficiency  PRIOR THERAPY: Feraheme infusion 4 doses, last dose was given 02/11/2016.  CURRENT THERAPY: None.  INTERVAL HISTORY: Sarah Watson 49 y.o. female returns to the clinic today for follow-up visit. The patient is feeling fine today except for the persistent fatigue as well as weight loss. She lost around 12 pounds since her last visit in July 2016. She is under a lot of stress from her job but currently off work. She denied having any dizzy spells. She has no significant chest pain, shortness of breath, cough or hemoptysis. She has no nausea or vomiting, no fever or chills. She was last seen in July 2017 and was supposed to have follow-up visit in 3 months but the patient did not show up for her visit as scheduled. She is here today for reevaluation.  MEDICAL HISTORY: Past Medical History  Diagnosis Date  . NSVD (normal spontaneous vaginal delivery)   . Ectopic pregnancy   . Gestational diabetes   . Oligomenorrhea   . Eczema   . Anemia     ALLERGIES:  is allergic to penicillins.  MEDICATIONS:  Current Outpatient Prescriptions  Medication Sig Dispense Refill  . cholecalciferol (VITAMIN D) 1000 UNITS tablet Take 1,000 Units by mouth daily.    . ferrous sulfate 325 (65 FE) MG tablet Take 325 mg by mouth 3 (three) times daily.  11  . mirtazapine (REMERON) 30 MG tablet Take 1 tablet (30 mg total) by mouth at bedtime. 30 tablet 2  . Multiple Vitamins-Minerals (MULTIVITAMIN PO) Take 1 tablet by mouth daily. States she takes this when she remembers it     No current facility-administered medications for this visit.    SURGICAL HISTORY:  Past Surgical History  Procedure Laterality Date  . Pelvic laparoscopy      RIGHT  SALPINGECTOMY  . Tubal ligation    . Neuroma surgery  2008    REMOVAL OF LEFT EAR ACOUSTIC NEUROMA  . Colonoscopy N/A 08/03/2014    Procedure: COLONOSCOPY;  Surgeon: Danie Binder, MD;  Location: [facility];  Service: Endoscopy;  Laterality: N/A;  930  . Esophagogastroduodenoscopy N/A 08/03/2014    Procedure: ESOPHAGOGASTRODUODENOSCOPY (EGD);  Surgeon: Danie Binder, MD;  Location: [facility];  Service: Endoscopy;  Laterality: N/A;    REVIEW OF SYSTEMS:  A comprehensive review of systems was negative except for: Constitutional: positive for anorexia, fatigue and weight loss Behavioral/Psych: positive for depression   PHYSICAL EXAMINATION: General appearance: alert, cooperative, fatigued and no distress Head: Normocephalic, without obvious abnormality, atraumatic Neck: no adenopathy, no JVD, supple, symmetrical, trachea midline and thyroid not enlarged, symmetric, no tenderness/mass/nodules Lymph nodes: Cervical, supraclavicular, and axillary nodes normal. Resp: clear to auscultation bilaterally Back: symmetric, no curvature. ROM normal. No CVA tenderness. Cardio: regular rate and rhythm, S1, S2 normal, no murmur, click, rub or gallop GI: soft, non-tender; bowel sounds normal; no masses,  no organomegaly Extremities: extremities normal, atraumatic, no cyanosis or edema Neurologic: Alert and oriented X 3, normal strength and tone. Normal symmetric reflexes. Normal coordination and gait  ECOG PERFORMANCE STATUS: 1 - Symptomatic but completely ambulatory  Blood pressure 107/63, pulse 110, temperature 101 F (38.3 C), temperature source Oral, resp. rate 18, height 5\' 3"  (1.6 m), weight 111 lb 9.6 oz (50.621  kg), last menstrual period 11/20/2013, SpO2 100 %.  LABORATORY DATA: Lab Results  Component Value Date   WBC 12.8* 02/15/2016   HGB 9.6* 02/15/2016   HCT 30.2* 02/15/2016   MCV 88.9 02/15/2016   PLT 628* 02/15/2016      Chemistry      Component Value Date/Time   NA 140  04/27/2015 1300   NA 136* 03/24/2014 1602   K 4.6 04/27/2015 1300   K 4.5 03/24/2014 1602   CL 99 03/24/2014 1602   CO2 25 04/27/2015 1300   CO2 27 03/24/2014 1602   BUN 12.3 04/27/2015 1300   BUN 18 03/24/2014 1602   CREATININE 1.2* 04/27/2015 1300   CREATININE 1.07 03/24/2014 1602      Component Value Date/Time   CALCIUM 8.7 04/27/2015 1300   CALCIUM 9.2 03/24/2014 1602   ALKPHOS 82 04/27/2015 1300   ALKPHOS 82 03/24/2014 1602   AST 15 04/27/2015 1300   AST 17 03/24/2014 1602   ALT 13 04/27/2015 1300   ALT 10 03/24/2014 1602   BILITOT 0.31 04/27/2015 1300   BILITOT 0.2* 03/24/2014 1602       RADIOGRAPHIC STUDIES: No results found.  ASSESSMENT AND PLAN: This is a very pleasant 50 years old female with persistent anemia likely secondary to iron deficiency in addition to anemia of chronic disease. The patient also has persistent weight loss secondary to lack of appetite and depression. She received 4 doses of IV infusion with Feraheme prognosis in July 2016 and 2 doses recently last week. Her hemoglobin and hematocrit are low but stable. For the depression, I asked the patient to continue on Remeron 30 mg by mouth daily at bedtime. The patient would come back for follow-up visit in 3 months for reevaluation with repeat CBC, iron study, ferritin and TSH. The patient continues to have persistent weight loss, she may need imaging studies including CT scan of the chest, abdomen and pelvis to rule out any underlying malignancy. She was advised to call immediately if she has any concerning symptoms in the interval. The patient voices understanding of current disease status and treatment options and is in agreement with the current care plan.  All questions were answered. The patient knows to call the clinic with any problems, questions or concerns. We can certainly see the patient much sooner if necessary.  I spent 15 minutes counseling the patient face to face. The total time spent  in the appointment was 25 minutes.  Disclaimer: This note was dictated with voice recognition software. Similar sounding words can inadvertently be transcribed and may not be corrected upon review.

## 2016-02-15 NOTE — Telephone Encounter (Signed)
Gave and printed appt sched and avs for pt for May and JUNE °

## 2016-02-16 LAB — IRON AND TIBC
%SAT: 59 % — AB (ref 21–57)
Iron: 84 ug/dL (ref 41–142)
TIBC: 141 ug/dL — AB (ref 236–444)
UIBC: 57 ug/dL — AB (ref 120–384)

## 2016-02-16 LAB — FERRITIN: Ferritin: 2875 ng/ml — ABNORMAL HIGH (ref 9–269)

## 2016-02-18 NOTE — Progress Notes (Signed)
Patient received Feraheme infusion x 2. February 04, 2016 and February 11, 2016.

## 2016-02-21 ENCOUNTER — Emergency Department (HOSPITAL_COMMUNITY): Payer: BC Managed Care – PPO

## 2016-02-21 ENCOUNTER — Encounter (HOSPITAL_COMMUNITY): Payer: Self-pay | Admitting: *Deleted

## 2016-02-21 ENCOUNTER — Emergency Department (HOSPITAL_COMMUNITY)
Admission: EM | Admit: 2016-02-21 | Discharge: 2016-02-21 | Disposition: A | Discharge status: Home / Self Care | Payer: BC Managed Care – PPO | Attending: Emergency Medicine | Admitting: Emergency Medicine

## 2016-02-21 DIAGNOSIS — Z880 Allergy status to penicillin: Secondary | ICD-10-CM | POA: Insufficient documentation

## 2016-02-21 DIAGNOSIS — R630 Anorexia: Secondary | ICD-10-CM | POA: Diagnosis not present

## 2016-02-21 DIAGNOSIS — Z872 Personal history of diseases of the skin and subcutaneous tissue: Secondary | ICD-10-CM | POA: Insufficient documentation

## 2016-02-21 DIAGNOSIS — D649 Anemia, unspecified: Secondary | ICD-10-CM | POA: Diagnosis not present

## 2016-02-21 DIAGNOSIS — Z8742 Personal history of other diseases of the female genital tract: Secondary | ICD-10-CM | POA: Diagnosis not present

## 2016-02-21 DIAGNOSIS — R319 Hematuria, unspecified: Secondary | ICD-10-CM

## 2016-02-21 DIAGNOSIS — Z8632 Personal history of gestational diabetes: Secondary | ICD-10-CM | POA: Diagnosis not present

## 2016-02-21 DIAGNOSIS — N202 Calculus of kidney with calculus of ureter: Secondary | ICD-10-CM | POA: Diagnosis not present

## 2016-02-21 DIAGNOSIS — N1330 Unspecified hydronephrosis: Secondary | ICD-10-CM | POA: Insufficient documentation

## 2016-02-21 DIAGNOSIS — N390 Urinary tract infection, site not specified: Secondary | ICD-10-CM | POA: Insufficient documentation

## 2016-02-21 DIAGNOSIS — K5900 Constipation, unspecified: Secondary | ICD-10-CM | POA: Diagnosis not present

## 2016-02-21 DIAGNOSIS — Z3202 Encounter for pregnancy test, result negative: Secondary | ICD-10-CM | POA: Diagnosis not present

## 2016-02-21 DIAGNOSIS — R1011 Right upper quadrant pain: Secondary | ICD-10-CM | POA: Diagnosis present

## 2016-02-21 DIAGNOSIS — R634 Abnormal weight loss: Secondary | ICD-10-CM | POA: Diagnosis not present

## 2016-02-21 DIAGNOSIS — Z791 Long term (current) use of non-steroidal anti-inflammatories (NSAID): Secondary | ICD-10-CM | POA: Insufficient documentation

## 2016-02-21 DIAGNOSIS — Z79899 Other long term (current) drug therapy: Secondary | ICD-10-CM | POA: Diagnosis not present

## 2016-02-21 DIAGNOSIS — N200 Calculus of kidney: Secondary | ICD-10-CM

## 2016-02-21 LAB — CBC
HEMATOCRIT: 30.4 % — AB (ref 36.0–46.0)
Hemoglobin: 9.7 g/dL — ABNORMAL LOW (ref 12.0–15.0)
MCH: 29.4 pg (ref 26.0–34.0)
MCHC: 31.9 g/dL (ref 30.0–36.0)
MCV: 92.1 fL (ref 78.0–100.0)
Platelets: 685 10*3/uL — ABNORMAL HIGH (ref 150–400)
RBC: 3.3 MIL/uL — AB (ref 3.87–5.11)
RDW: 14.8 % (ref 11.5–15.5)
WBC: 14.2 10*3/uL — AB (ref 4.0–10.5)

## 2016-02-21 LAB — COMPREHENSIVE METABOLIC PANEL
ALT: 20 U/L (ref 14–54)
AST: 18 U/L (ref 15–41)
Albumin: 2.3 g/dL — ABNORMAL LOW (ref 3.5–5.0)
Alkaline Phosphatase: 107 U/L (ref 38–126)
Anion gap: 10 (ref 5–15)
BUN: 19 mg/dL (ref 6–20)
CHLORIDE: 97 mmol/L — AB (ref 101–111)
CO2: 28 mmol/L (ref 22–32)
Calcium: 9.2 mg/dL (ref 8.9–10.3)
Creatinine, Ser: 1.44 mg/dL — ABNORMAL HIGH (ref 0.44–1.00)
GFR, EST AFRICAN AMERICAN: 49 mL/min — AB (ref >60)
GFR, EST NON AFRICAN AMERICAN: 42 mL/min — AB (ref >60)
Glucose, Bld: 99 mg/dL (ref 65–99)
POTASSIUM: 4.3 mmol/L (ref 3.5–5.1)
Sodium: 135 mmol/L (ref 135–145)
Total Bilirubin: 0.4 mg/dL (ref 0.3–1.2)
Total Protein: 9.9 g/dL — ABNORMAL HIGH (ref 6.5–8.1)

## 2016-02-21 LAB — URINE MICROSCOPIC-ADD ON

## 2016-02-21 LAB — URINALYSIS, ROUTINE W REFLEX MICROSCOPIC
Bilirubin Urine: NEGATIVE
GLUCOSE, UA: NEGATIVE mg/dL
Ketones, ur: NEGATIVE mg/dL
Nitrite: POSITIVE — AB
PH: 7 (ref 5.0–8.0)
PROTEIN: 30 mg/dL — AB
Specific Gravity, Urine: 1.01 (ref 1.005–1.030)

## 2016-02-21 LAB — LIPASE, BLOOD: LIPASE: 41 U/L (ref 11–51)

## 2016-02-21 LAB — PREGNANCY, URINE: Preg Test, Ur: NEGATIVE

## 2016-02-21 LAB — TSH: TSH: 2.249 u[IU]/mL (ref 0.350–4.500)

## 2016-02-21 MED ORDER — IOPAMIDOL (ISOVUE-300) INJECTION 61%
INTRAVENOUS | Status: AC
  Filled 2016-02-21: qty 75

## 2016-02-21 MED ORDER — CIPROFLOXACIN HCL 500 MG PO TABS: 500.0000 mg | ORAL_TABLET | Freq: Two times a day (BID) | ORAL | Status: DC

## 2016-02-21 MED ORDER — SODIUM CHLORIDE 0.9 % IV BOLUS (SEPSIS)
1000.0000 mL | Freq: Once | INTRAVENOUS | Status: AC
  Administered 2016-02-21: 1000 mL via INTRAVENOUS

## 2016-02-21 NOTE — Discharge Instructions (Signed)
Urinary Tract Infection °Urinary tract infections (UTIs) can develop anywhere along your urinary tract. Your urinary tract is your body's drainage system for removing wastes and extra water. Your urinary tract includes two kidneys, two ureters, a bladder, and a urethra. Your kidneys are a pair of bean-shaped organs. Each kidney is about the size of your fist. They are located below your ribs, one on each side of your spine. °CAUSES °Infections are caused by microbes, which are microscopic organisms, including fungi, viruses, and bacteria. These organisms are so small that they can only be seen through a microscope. Bacteria are the microbes that most commonly cause UTIs. °SYMPTOMS  °Symptoms of UTIs may vary by age and gender of the patient and by the location of the infection. Symptoms in young women typically include a frequent and intense urge to urinate and a painful, burning feeling in the bladder or urethra during urination. Older women and men are more likely to be tired, shaky, and weak and have muscle aches and abdominal pain. A fever may mean the infection is in your kidneys. Other symptoms of a kidney infection include pain in your back or sides below the ribs, nausea, and vomiting. °DIAGNOSIS °To diagnose a UTI, your caregiver will ask you about your symptoms. Your caregiver will also ask you to provide a urine sample. The urine sample will be tested for bacteria and white blood cells. White blood cells are made by your body to help fight infection. °TREATMENT  °Typically, UTIs can be treated with medication. Because most UTIs are caused by a bacterial infection, they usually can be treated with the use of antibiotics. The choice of antibiotic and length of treatment depend on your symptoms and the type of bacteria causing your infection. °HOME CARE INSTRUCTIONS °· If you were prescribed antibiotics, take them exactly as your caregiver instructs you. Finish the medication even if you feel better after  you have only taken some of the medication. °· Drink enough water and fluids to keep your urine clear or pale yellow. °· Avoid caffeine, tea, and carbonated beverages. They tend to irritate your bladder. °· Empty your bladder often. Avoid holding urine for long periods of time. °· Empty your bladder before and after sexual intercourse. °· After a bowel movement, women should cleanse from front to back. Use each tissue only once. °SEEK MEDICAL CARE IF:  °· You have back pain. °· You develop a fever. °· Your symptoms do not begin to resolve within 3 days. °SEEK IMMEDIATE MEDICAL CARE IF:  °· You have severe back pain or lower abdominal pain. °· You develop chills. °· You have nausea or vomiting. °· You have continued burning or discomfort with urination. °MAKE SURE YOU:  °· Understand these instructions. °· Will watch your condition. °· Will get help right away if you are not doing well or get worse. °  °This information is not intended to replace advice given to you by your health care provider. Make sure you discuss any questions you have with your health care provider. °  °Document Released: 08/09/2005 Document Revised: 07/21/2015 Document Reviewed: 12/08/2011 °Elsevier Interactive Patient Education ©2016 [employer] °Kidney Stones °Kidney stones (urolithiasis) are deposits that form inside your kidneys. The intense pain is caused by the stone moving through the urinary tract. When the stone moves, the ureter goes into spasm around the stone. The stone is usually passed in the urine.  °CAUSES  °· A disorder that makes certain neck glands produce too much parathyroid hormone (  primary hyperparathyroidism). °· A buildup of uric acid crystals, similar to gout in your joints. °· Narrowing (stricture) of the ureter. °· A kidney obstruction present at birth (congenital obstruction). °· Previous surgery on the kidney or ureters. °· Numerous kidney infections. °SYMPTOMS  °· Feeling sick to your stomach  (nauseous). °· Throwing up (vomiting). °· Blood in the urine (hematuria). °· Pain that usually spreads (radiates) to the groin. °· Frequency or urgency of urination. °DIAGNOSIS  °· Taking a history and physical exam. °· Blood or urine tests. °· CT scan. °· Occasionally, an examination of the inside of the urinary bladder (cystoscopy) is performed. °TREATMENT  °· Observation. °· Increasing your fluid intake. °· Extracorporeal shock wave lithotripsy--This is a noninvasive procedure that uses shock waves to break up kidney stones. °· Surgery may be needed if you have severe pain or persistent obstruction. There are various surgical procedures. Most of the procedures are performed with the use of small instruments. Only small incisions are needed to accommodate these instruments, so recovery time is minimized. °The size, location, and chemical composition are all important variables that will determine the proper choice of action for you. Talk to your health care provider to better understand your situation so that you will minimize the risk of injury to yourself and your kidney.  °HOME CARE INSTRUCTIONS  °· Drink enough water and fluids to keep your urine clear or pale yellow. This will help you to pass the stone or stone fragments. °· Strain all urine through the provided strainer. Keep all particulate matter and stones for your health care provider to see. The stone causing the pain may be as small as a grain of salt. It is very important to use the strainer each and every time you pass your urine. The collection of your stone will allow your health care provider to analyze it and verify that a stone has actually passed. The stone analysis will often identify what you can do to reduce the incidence of recurrences. °· Only take over-the-counter or prescription medicines for pain, discomfort, or fever as directed by your health care provider. °· Keep all follow-up visits as told by your health care provider. This is  important. °· Get follow-up X-rays if required. The absence of pain does not always mean that the stone has passed. It may have only stopped moving. If the urine remains completely obstructed, it can cause loss of kidney function or even complete destruction of the kidney. It is your responsibility to make sure X-rays and follow-ups are completed. Ultrasounds of the kidney can show blockages and the status of the kidney. Ultrasounds are not associated with any radiation and can be performed easily in a matter of minutes. °· Make changes to your daily diet as told by your health care provider. You may be told to: °¨ Limit the amount of salt that you eat. °¨ Eat 5 or more servings of fruits and vegetables each day. °¨ Limit the amount of meat, poultry, fish, and eggs that you eat. °· Collect a 24-hour urine sample as told by your health care provider. You may need to collect another urine sample every 6-12 months. °SEEK MEDICAL CARE IF: °· You experience pain that is progressive and unresponsive to any pain medicine you have been prescribed. °SEEK IMMEDIATE MEDICAL CARE IF:  °· Pain cannot be controlled with the prescribed medicine. °· You have a fever or shaking chills. °· The severity or intensity of pain increases over 18 hours and is not relieved   by pain medicine. °· You develop a new onset of abdominal pain. °· You feel faint or pass out. °· You are unable to urinate. °  °This information is not intended to replace advice given to you by your health care provider. Make sure you discuss any questions you have with your health care provider. °  °Document Released: 10/30/2005 Document Revised: 07/21/2015 Document Reviewed: 04/02/2013 °Elsevier Interactive Patient Education ©2016 [employer] ° °

## 2016-02-21 NOTE — ED Notes (Signed)
Pt has multiple complaints. Reports abd pains, fatigue, weight loss and anxiety. No acute distress noted at triage.

## 2016-02-21 NOTE — ED Provider Notes (Signed)
CSN: [id number]     Arrival date & time 02/21/16  1305 History   First MD Initiated Contact with Patient 02/21/16 1616     Chief Complaint  Patient presents with  . Abdominal Pain  . Weight Loss     (Consider location/radiation/quality/duration/timing/severity/associated sxs/prior Treatment) Patient is a 50 y.o. female presenting with abdominal pain.  Abdominal Pain Pain location: right sided upper and lower. Pain quality: cramping   Pain radiates to:  Does not radiate Pain severity:  Moderate Onset quality:  Gradual Duration:  4 days Timing:  Intermittent Progression:  Waxing and waning Chronicity:  Recurrent (over last 3 weeks) Context: previous surgery (remote ectopic)   Context: not alcohol use, not diet changes and not suspicious food intake   Relieved by:  NSAIDs Exacerbated by: when she is upset. Associated symptoms: anorexia, chills and constipation (BMs every 2 days)   Associated symptoms: no fever, no hematochezia, no nausea and no vomiting   Associated symptoms comment:  Some loss of appetite   Past Medical History  Diagnosis Date  . NSVD (normal spontaneous vaginal delivery)   . Ectopic pregnancy   . Gestational diabetes   . Oligomenorrhea   . Eczema   . Anemia    Past Surgical History  Procedure Laterality Date  . Pelvic laparoscopy      RIGHT SALPINGECTOMY  . Tubal ligation    . Neuroma surgery  2008    REMOVAL OF LEFT EAR ACOUSTIC NEUROMA  . Colonoscopy N/A 08/03/2014    Procedure: COLONOSCOPY;  Surgeon: Danie Binder, MD;  Location: [facility];  Service: Endoscopy;  Laterality: N/A;  930  . Esophagogastroduodenoscopy N/A 08/03/2014    Procedure: ESOPHAGOGASTRODUODENOSCOPY (EGD);  Surgeon: Danie Binder, MD;  Location: [facility];  Service: Endoscopy;  Laterality: N/A;   Family History  Problem Relation Age of Onset  . Diabetes Paternal Aunt   . Cancer Paternal Grandfather     PROSTATE  . Colon cancer Neg Hx    Social History  Substance  Use Topics  . Smoking status: Never Smoker   . Smokeless tobacco: Never Used     Comment: Never smoked  . Alcohol Use: No   OB History    Gravida Para Term Preterm AB TAB SAB Ectopic Multiple Living   4 4 2       2      Review of Systems  Constitutional: Positive for chills. Negative for fever.  Gastrointestinal: Positive for abdominal pain, constipation (BMs every 2 days) and anorexia. Negative for nausea, vomiting and hematochezia.  All other systems reviewed and are negative.     Allergies  Penicillins  Home Medications   Prior to Admission medications   Medication Sig Start Date End Date Taking? Authorizing Provider  cholecalciferol (VITAMIN D) 1000 UNITS tablet Take 1,000 Units by mouth daily.   Yes Historical Provider, MD  ferrous sulfate 325 (65 FE) MG tablet Take 325 mg by mouth daily with breakfast.  04/03/15  Yes Historical Provider, MD  ibuprofen (ADVIL,MOTRIN) 200 MG tablet Take 200 mg by mouth 3 (three) times daily.   Yes Historical Provider, MD  Menthol-Methyl Salicylate (BEN GAY GREASELESS) 10-15 % greaseless cream Apply 1 application topically 4 (four) times daily as needed for pain.   Yes Historical Provider, MD  Multiple Vitamins-Minerals (MULTIVITAMIN PO) Take 1 tablet by mouth daily. States she takes this when she remembers it   Yes Historical Provider, MD  mirtazapine (REMERON) 30 MG tablet Take 1 tablet (30  mg total) by mouth at bedtime. 05/27/15   Curt Bears, MD   BP 105/71 mmHg  Pulse 88  Temp(Src) 98 F (36.7 C) (Oral)  Resp 16  SpO2 100%  LMP 11/20/2013 Physical Exam  Constitutional: She is oriented to person, place, and time. She appears well-developed and well-nourished.  Non-toxic appearance. She does not appear ill. No distress.  HENT:  Head: Normocephalic.  Eyes: Conjunctivae are normal.  Neck: Neck supple. No tracheal deviation present.  Cardiovascular: Normal rate, regular rhythm and normal heart sounds.   Pulmonary/Chest: Effort  normal and breath sounds normal. No respiratory distress.  Abdominal: Soft. She exhibits no distension. There is tenderness (mild right sided). There is no rebound, no guarding, no tenderness at McBurney's point and negative Murphy's sign.  Neurological: She is alert and oriented to person, place, and time.  Skin: Skin is warm and dry. She is not diaphoretic.  Psychiatric: She has a normal mood and affect.  Vitals reviewed.   ED Course  Procedures (including critical care time) Labs Review Labs Reviewed  COMPREHENSIVE METABOLIC PANEL - Abnormal; Notable for the following:    Chloride 97 (*)    Creatinine, Ser 1.44 (*)    Total Protein 9.9 (*)    Albumin 2.3 (*)    GFR calc non Af Amer 42 (*)    GFR calc Af Amer 49 (*)    All other components within normal limits  CBC - Abnormal; Notable for the following:    WBC 14.2 (*)    RBC 3.30 (*)    Hemoglobin 9.7 (*)    HCT 30.4 (*)    Platelets 685 (*)    All other components within normal limits  URINALYSIS, ROUTINE W REFLEX MICROSCOPIC (NOT AT [facility]) - Abnormal; Notable for the following:    APPearance CLOUDY (*)    Hgb urine dipstick MODERATE (*)    Protein, ur 30 (*)    Nitrite POSITIVE (*)    Leukocytes, UA LARGE (*)    All other components within normal limits  URINE MICROSCOPIC-ADD ON - Abnormal; Notable for the following:    Squamous Epithelial / LPF 0-5 (*)    Bacteria, UA MANY (*)    All other components within normal limits  URINE CULTURE  LIPASE, BLOOD  TSH  PREGNANCY, URINE    Imaging Review Ct Abdomen Pelvis W Contrast  02/21/2016  CLINICAL DATA:  Abdominal pain and weight loss EXAM: CT ABDOMEN AND PELVIS WITH CONTRAST TECHNIQUE: Multidetector CT imaging of the abdomen and pelvis was performed using the standard protocol following bolus administration of intravenous contrast. Oral contrast was also administered. CONTRAST:  75 mL Isovue-300 nonionic COMPARISON:  None FINDINGS: Lower chest: There is atelectasis in the  right lung base region. Lung bases otherwise are clear. Hepatobiliary: There is mild fatty infiltration near the fissure for the ligamentum teres. No focal liver lesions are identified. Gallbladder wall is not appreciably thickened. There is no biliary duct dilatation. Pancreas: No pancreatic mass or inflammatory focus. Spleen: No splenic lesions are evident. Adrenals/Urinary Tract: Adrenals appear normal. There are multiple calculi in the left kidney. There is a calculus in the midportion left calyx measuring 1.2 x 0.7 cm. There is a calculus in a posterior midportion left renal calyx measuring 6 x 6 mm. There is a calculus in an anterior mid left renal calyx measuring 6 x 6 mm. On the right, there is severe hydronephrosis. 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 This complex fluid collection displaces the right kidney anteriorly. There are calculi within the right ureter ranging in size from between 4 and 6 mm. At the level renal pelvis on the right, there is a calculus measuring 1.1 x 0.8 cm. At the ureteropelvic junction, there is a calculus on the right measuring there are smaller surrounding calculi with what appears to be hematoma in the region of the right ureteropelvic junction. No more distal calculi are identified. There is significantly diminished enhancement of the right kidney. Urinary bladder is midline with wall thickness within normal limits. Stomach/Bowel: There is no bowel wall or mesenteric thickening. No bowel obstruction. No free air or portal venous air. Vascular/Lymphatic: There is no appreciable abdominal aortic aneurysm. No vascular lesions are evident. No adenopathy is appreciable in the abdomen or pelvis. Reproductive: The uterus is anteverted. There is enlargement of the cervix. Outside of the cervix, there is no pelvic mass or pelvic fluid collection. Other: Appendix appears  unremarkable. No abscess or ascites is evident in the abdomen or pelvis. Musculoskeletal: There is mild degenerative change in the lumbar spine. There are no blastic or lytic bone lesions. No intramuscular or abdominal wall lesion. IMPRESSION: There is evidence of renal obstruction on the right with calculi in the right renal pelvis and proximal right ureter. There appears to be hematoma surrounding calculi in the proximal right ureter. 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 Infected fluid in this area is certainly possible and cannot be excluded. There is extensive perinephric stranding extending into the upper pelvis on the right laterally. Multiple calculi are noted in the left kidney without appreciable hydronephrosis. No ureteral calculi noted on the left. Prominence of the cervix. This finding warrants direct visualization and Pap smear to further evaluate. No adenopathy. Appendix appears within normal limits. No bowel obstruction. No abscess. Electronically Signed   By: Lowella Grip III M.D.   On: 02/21/2016 20:34   I have personally reviewed and evaluated these images and lab results as part of my medical decision-making.   EKG Interpretation None      MDM   Final diagnoses:  Hydronephrosis of right kidney  Kidney stones  Urinary tract infection with hematuria, site unspecified   50 y.o. female presents with multiple vague issues but her main complaint today is abdominal pain on the right side that is difficult to describe. She does have progressive fatigue, weight loss, and has been increasingly anxious. On chart review she is seen for anemia of chronic disease and iron deficiency anemia. She is receiving iron supplementation to help with this and is being seen by hematology. Her ongoing weight loss has been attributed to decreased appetite with ongoing depression  but she has had no imaging of her abdomen performed since the onset of symptoms and is now complaining of pain. I recommended she undergo screening with CT today to rule out occult oncologic process underlying her weight loss and pain. Her clinical appearance and lab work is consistent with mild dehydration so she was given IV fluids. Her white blood cell count is increasing steadily which is nonspecific. I doubt appendicitis, cholecystitis, or other surgical emergency currently.   CT is consistent with an obstructive uropathy and possible infection. 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 Recommending close follow up on ABx.  She has been seen for anemia possibly relating  to anemia of chronic disease that has been thus-far attributed to depression and poor appetite but this may be a contributing factor. Patient will be started on ciprofloxacin to treat likely chronic complicated infection that is also evident in urine and was instructed to follow up with urology closely for surgical consultation. Urine culture pending for speciation and follow up. She was also recommended to follow up with her PCP closely and return to the ED with any fevers, inability to tolerate PO, or signs of worsening.   Leo Grosser, MD 02/22/16 [phone]

## 2016-02-24 LAB — URINE CULTURE

## 2016-03-07 ENCOUNTER — Other Ambulatory Visit (HOSPITAL_BASED_OUTPATIENT_CLINIC_OR_DEPARTMENT_OTHER): Payer: Self-pay

## 2016-03-07 ENCOUNTER — Other Ambulatory Visit: Payer: Self-pay | Admitting: Family Medicine

## 2016-03-07 DIAGNOSIS — Z1231 Encounter for screening mammogram for malignant neoplasm of breast: Secondary | ICD-10-CM

## 2016-03-16 ENCOUNTER — Ambulatory Visit (HOSPITAL_BASED_OUTPATIENT_CLINIC_OR_DEPARTMENT_OTHER)
Admission: RE | Admit: 2016-03-16 | Discharge: 2016-03-16 | Disposition: A | Discharge status: Home / Self Care | Payer: BC Managed Care – PPO | Source: Ambulatory Visit | Attending: Family Medicine | Admitting: Family Medicine

## 2016-03-16 DIAGNOSIS — Z1231 Encounter for screening mammogram for malignant neoplasm of breast: Secondary | ICD-10-CM | POA: Diagnosis present

## 2016-03-30 ENCOUNTER — Encounter (HOSPITAL_BASED_OUTPATIENT_CLINIC_OR_DEPARTMENT_OTHER): Payer: Self-pay | Admitting: *Deleted

## 2016-03-30 ENCOUNTER — Other Ambulatory Visit: Payer: Self-pay | Admitting: Urology

## 2016-03-30 NOTE — Progress Notes (Signed)
NPO AFTER MN.  ARRIVE AT 0730.  NEEDS HG.

## 2016-03-31 ENCOUNTER — Encounter (HOSPITAL_BASED_OUTPATIENT_CLINIC_OR_DEPARTMENT_OTHER): Payer: Self-pay | Admitting: *Deleted

## 2016-03-31 ENCOUNTER — Ambulatory Visit (HOSPITAL_BASED_OUTPATIENT_CLINIC_OR_DEPARTMENT_OTHER)
Admission: RE | Admit: 2016-03-31 | Discharge: 2016-03-31 | Disposition: A | Discharge status: Home / Self Care | Payer: BC Managed Care – PPO | Source: Ambulatory Visit | Attending: Urology | Admitting: Urology

## 2016-03-31 ENCOUNTER — Encounter (HOSPITAL_BASED_OUTPATIENT_CLINIC_OR_DEPARTMENT_OTHER)
Admission: RE | Disposition: A | Discharge status: Home / Self Care | Payer: Self-pay | Source: Ambulatory Visit | Attending: Urology

## 2016-03-31 ENCOUNTER — Ambulatory Visit (HOSPITAL_BASED_OUTPATIENT_CLINIC_OR_DEPARTMENT_OTHER): Payer: BC Managed Care – PPO | Admitting: Anesthesiology

## 2016-03-31 ENCOUNTER — Other Ambulatory Visit (HOSPITAL_COMMUNITY): Payer: Self-pay | Admitting: Urology

## 2016-03-31 DIAGNOSIS — N132 Hydronephrosis with renal and ureteral calculous obstruction: Secondary | ICD-10-CM | POA: Insufficient documentation

## 2016-03-31 DIAGNOSIS — N135 Crossing vessel and stricture of ureter without hydronephrosis: Secondary | ICD-10-CM

## 2016-03-31 DIAGNOSIS — Z791 Long term (current) use of non-steroidal anti-inflammatories (NSAID): Secondary | ICD-10-CM | POA: Insufficient documentation

## 2016-03-31 DIAGNOSIS — N200 Calculus of kidney: Secondary | ICD-10-CM

## 2016-03-31 DIAGNOSIS — D649 Anemia, unspecified: Secondary | ICD-10-CM | POA: Diagnosis not present

## 2016-03-31 DIAGNOSIS — Z79899 Other long term (current) drug therapy: Secondary | ICD-10-CM | POA: Insufficient documentation

## 2016-03-31 DIAGNOSIS — N1330 Unspecified hydronephrosis: Secondary | ICD-10-CM

## 2016-03-31 HISTORY — PX: CYSTOSCOPY W/ URETERAL STENT PLACEMENT: SHX1429

## 2016-03-31 HISTORY — DX: Personal history of gestational diabetes: Z86.32

## 2016-03-31 HISTORY — DX: Iron deficiency anemia, unspecified: D50.9

## 2016-03-31 HISTORY — DX: Nocturia: R35.1

## 2016-03-31 HISTORY — DX: Calculus of kidney: N20.0

## 2016-03-31 HISTORY — DX: Unspecified hydronephrosis: N13.30

## 2016-03-31 HISTORY — DX: Calculus of ureter: N20.1

## 2016-03-31 HISTORY — DX: Personal history of other complications of pregnancy, childbirth and the puerperium: Z87.59

## 2016-03-31 LAB — POCT HEMOGLOBIN-HEMACUE: Hemoglobin: 9.3 g/dL — ABNORMAL LOW (ref 12.0–15.0)

## 2016-03-31 SURGERY — CYSTOSCOPY, WITH RETROGRADE PYELOGRAM AND URETERAL STENT INSERTION
Anesthesia: General | Site: Ureter | Laterality: Right

## 2016-03-31 MED ORDER — ONDANSETRON HCL 4 MG/2ML IJ SOLN
INTRAMUSCULAR | Status: AC
  Filled 2016-03-31: qty 2

## 2016-03-31 MED ORDER — SODIUM CHLORIDE 0.9 % IR SOLN
Status: DC | PRN
  Administered 2016-03-31: 3000 mL
  Administered 2016-03-31: 500 mL

## 2016-03-31 MED ORDER — PHENAZOPYRIDINE HCL 200 MG PO TABS
200.0000 mg | ORAL_TABLET | Freq: Once | ORAL | Status: AC
  Administered 2016-03-31: 200 mg via ORAL
  Filled 2016-03-31: qty 1

## 2016-03-31 MED ORDER — FENTANYL CITRATE (PF) 100 MCG/2ML IJ SOLN
INTRAMUSCULAR | Status: DC | PRN
  Administered 2016-03-31 (×4): 25 ug via INTRAVENOUS

## 2016-03-31 MED ORDER — PHENAZOPYRIDINE HCL 100 MG PO TABS
ORAL_TABLET | ORAL | Status: AC
  Filled 2016-03-31: qty 2

## 2016-03-31 MED ORDER — SCOPOLAMINE 1 MG/3DAYS TD PT72
1.0000 | MEDICATED_PATCH | TRANSDERMAL | Status: DC
  Administered 2016-03-31: 1.5 mg via TRANSDERMAL
  Filled 2016-03-31: qty 1

## 2016-03-31 MED ORDER — LIDOCAINE HCL (CARDIAC) 20 MG/ML IV SOLN
INTRAVENOUS | Status: AC
  Filled 2016-03-31: qty 5

## 2016-03-31 MED ORDER — PROPOFOL 10 MG/ML IV BOLUS
INTRAVENOUS | Status: DC | PRN
  Administered 2016-03-31: 160 mg via INTRAVENOUS

## 2016-03-31 MED ORDER — MIDAZOLAM HCL 2 MG/2ML IJ SOLN
INTRAMUSCULAR | Status: AC
  Filled 2016-03-31: qty 2

## 2016-03-31 MED ORDER — CIPROFLOXACIN IN D5W 400 MG/200ML IV SOLN
400.0000 mg | INTRAVENOUS | Status: AC
  Administered 2016-03-31: 400 mg via INTRAVENOUS
  Filled 2016-03-31: qty 200

## 2016-03-31 MED ORDER — DEXAMETHASONE SODIUM PHOSPHATE 4 MG/ML IJ SOLN
INTRAMUSCULAR | Status: DC | PRN
  Administered 2016-03-31: 10 mg via INTRAVENOUS

## 2016-03-31 MED ORDER — LIDOCAINE HCL (CARDIAC) 20 MG/ML IV SOLN
INTRAVENOUS | Status: DC | PRN
  Administered 2016-03-31: 60 mg via INTRAVENOUS

## 2016-03-31 MED ORDER — IOPAMIDOL (ISOVUE-370) INJECTION 76%
INTRAVENOUS | Status: DC | PRN
  Administered 2016-03-31: 27 mL

## 2016-03-31 MED ORDER — ONDANSETRON HCL 4 MG/2ML IJ SOLN
INTRAMUSCULAR | Status: DC | PRN
  Administered 2016-03-31: 4 mg via INTRAVENOUS

## 2016-03-31 MED ORDER — PHENAZOPYRIDINE HCL 200 MG PO TABS: 200.0000 mg | ORAL_TABLET | Freq: Three times a day (TID) | ORAL | Status: DC | PRN

## 2016-03-31 MED ORDER — MIDAZOLAM HCL 5 MG/5ML IJ SOLN
INTRAMUSCULAR | Status: DC | PRN
  Administered 2016-03-31: 2 mg via INTRAVENOUS

## 2016-03-31 MED ORDER — KETOROLAC TROMETHAMINE 30 MG/ML IJ SOLN
INTRAMUSCULAR | Status: AC
  Filled 2016-03-31: qty 1

## 2016-03-31 MED ORDER — FENTANYL CITRATE (PF) 100 MCG/2ML IJ SOLN
INTRAMUSCULAR | Status: AC
  Filled 2016-03-31: qty 2

## 2016-03-31 MED ORDER — LACTATED RINGERS IV SOLN
INTRAVENOUS | Status: DC
  Administered 2016-03-31: 08:00:00 via INTRAVENOUS
  Filled 2016-03-31: qty 1000

## 2016-03-31 MED ORDER — HYDROMORPHONE HCL 1 MG/ML IJ SOLN
0.2500 mg | INTRAMUSCULAR | Status: DC | PRN
  Filled 2016-03-31: qty 1

## 2016-03-31 MED ORDER — CIPROFLOXACIN IN D5W 400 MG/200ML IV SOLN
INTRAVENOUS | Status: AC
  Filled 2016-03-31: qty 200

## 2016-03-31 MED ORDER — PROPOFOL 10 MG/ML IV BOLUS
INTRAVENOUS | Status: AC
  Filled 2016-03-31: qty 20

## 2016-03-31 MED ORDER — SCOPOLAMINE 1 MG/3DAYS TD PT72
MEDICATED_PATCH | TRANSDERMAL | Status: AC
Start: 2016-03-31 — End: 2016-03-31
  Filled 2016-03-31: qty 1

## 2016-03-31 MED ORDER — PROMETHAZINE HCL 25 MG/ML IJ SOLN
6.2500 mg | INTRAMUSCULAR | Status: DC | PRN
  Filled 2016-03-31: qty 1

## 2016-03-31 MED ORDER — DEXAMETHASONE SODIUM PHOSPHATE 10 MG/ML IJ SOLN
INTRAMUSCULAR | Status: AC
  Filled 2016-03-31: qty 1

## 2016-03-31 MED ORDER — HYDROCODONE-ACETAMINOPHEN 10-325 MG PO TABS: 1.0000 | ORAL_TABLET | ORAL | Status: DC | PRN

## 2016-03-31 SURGICAL SUPPLY — 29 items
ADAPTER CATH URET PLST 4-6FR (CATHETERS) IMPLANT
BAG DRAIN URO-CYSTO SKYTR STRL (DRAIN) ×2 IMPLANT
CATH INTERMIT  6FR 70CM (CATHETERS) ×2 IMPLANT
CATH URET 5FR 28IN CONE TIP (BALLOONS)
CATH URET 5FR 70CM CONE TIP (BALLOONS) IMPLANT
CATH URET DUAL LUMEN 6-10FR 50 (CATHETERS) ×2 IMPLANT
CLOTH BEACON ORANGE TIMEOUT ST (SAFETY) ×2 IMPLANT
GLOVE BIO SURGEON STRL SZ 6.5 (GLOVE) ×2 IMPLANT
GLOVE BIO SURGEON STRL SZ7.5 (GLOVE) ×2 IMPLANT
GLOVE BIO SURGEON STRL SZ8 (GLOVE) ×2 IMPLANT
GLOVE INDICATOR 6.5 STRL GRN (GLOVE) ×2 IMPLANT
GLOVE INDICATOR 7.5 STRL GRN (GLOVE) ×2 IMPLANT
GOWN STRL REUS W/ TWL LRG LVL3 (GOWN DISPOSABLE) ×1 IMPLANT
GOWN STRL REUS W/ TWL XL LVL3 (GOWN DISPOSABLE) ×3 IMPLANT
GOWN STRL REUS W/TWL LRG LVL3 (GOWN DISPOSABLE) ×2
GOWN STRL REUS W/TWL XL LVL3 (GOWN DISPOSABLE) ×3
GUIDEWIRE 0.038 PTFE COATED (WIRE) ×2 IMPLANT
GUIDEWIRE ANG ZIPWIRE 038X150 (WIRE) ×2 IMPLANT
GUIDEWIRE STR DUAL SENSOR (WIRE) ×2 IMPLANT
IV NS IRRIG 3000ML ARTHROMATIC (IV SOLUTION) ×4 IMPLANT
KIT ROOM TURNOVER WOR (KITS) ×2 IMPLANT
MANIFOLD NEPTUNE II (INSTRUMENTS) ×2 IMPLANT
NS IRRIG 500ML POUR BTL (IV SOLUTION) IMPLANT
PACK CYSTO (CUSTOM PROCEDURE TRAY) ×2 IMPLANT
SHEATH ACCESS URETERAL 24CM (SHEATH) ×2 IMPLANT
SYRINGE 10CC LL (SYRINGE) ×2 IMPLANT
TUBE CONNECTING 12X1/4 (SUCTIONS) ×2 IMPLANT
WATER STERILE IRR 3000ML UROMA (IV SOLUTION) ×2 IMPLANT
WATER STERILE IRR 500ML POUR (IV SOLUTION) ×2 IMPLANT

## 2016-03-31 NOTE — Anesthesia Preprocedure Evaluation (Addendum)
Anesthesia Evaluation  Patient identified by MRN, date of birth, ID band Patient awake    Reviewed: Allergy & Precautions, NPO status , Patient's Chart, lab work & pertinent test results  History of Anesthesia Complications Negative for: history of anesthetic complications  Airway Mallampati: II  TM Distance: >3 FB Neck ROM: Full    Dental  (+) Teeth Intact, Dental Advisory Given   Pulmonary neg pulmonary ROS,    Pulmonary exam normal        Cardiovascular negative cardio ROS Normal cardiovascular exam     Neuro/Psych negative neurological ROS  negative psych ROS   GI/Hepatic negative GI ROS, Neg liver ROS,   Endo/Other    Renal/GU      Musculoskeletal   Abdominal   Peds  Hematology   Anesthesia Other Findings   Reproductive/Obstetrics                            Anesthesia Physical Anesthesia Plan  ASA: II  Anesthesia Plan: General   Post-op Pain Management:    Induction: Intravenous  Airway Management Planned: LMA  Additional Equipment:   Intra-op Plan:   Post-operative Plan: Extubation in OR  Informed Consent: I have reviewed the patients History and Physical, chart, labs and discussed the procedure including the risks, benefits and alternatives for the proposed anesthesia with the patient or authorized representative who has indicated his/her understanding and acceptance.   Dental advisory given  Plan Discussed with: CRNA and Anesthesiologist  Anesthesia Plan Comments:        Anesthesia Quick Evaluation

## 2016-03-31 NOTE — Op Note (Signed)
PATIENT:  Sarah Watson  PRE-OPERATIVE DIAGNOSIS: 1. Long-standing, high-grade right ureteral obstruction. 2. Right hydronephrosis 3. Right ureteral calculi  POST-OPERATIVE DIAGNOSIS: Same  PROCEDURE: 1. Cystoscopy with right retrograde pyelogram including interpretation. 2. Right ureteroscopy  SURGEON:  Claybon Jabs  INDICATION: Sarah Watson is a 50 year old female was found on CT scan to have marked high-grade right hydronephrosis with significant loss of renal parenchyma and what appeared to be perinephric hematoma versus possible urinoma although she was not having any fever and there was nothing to suggest infection. She has never passed a stone. She is not having significant flank pain but has having some slight discomfort in the right flank region. The cause of her hydronephrosis is large right proximal ureteral calculi. We discussed attempted stent placement with the understanding that the stones may be impacted and drainage of the kidney, if not possible via a retrograde approach, would need to be performed percutaneously.  ANESTHESIA:  General  EBL:  Minimal  DRAINS: None  LOCAL MEDICATIONS USED:  None  SPECIMEN:  None  Description of procedure: After informed consent the patient was taken to the operating room and placed on the table in a supine position. General anesthesia was then administered. Once fully anesthetized the patient was moved to the dorsal lithotomy position and the genitalia were sterilely prepped and draped in standard fashion. An official timeout was then performed.  The 23 French cystoscope was then passed into the bladder under direct vision with the 30 lens. The bladder was fully and systematically inspected and noted to be free of any tumors, stones or inflammatory lesions. Both ureteral orifices were noted to be of normal configuration and position.  I initially attempted to pass a 6 [place] open-ended catheter through the right UO but it was fairly  small so I passed a 0.038 inch floppy-tipped guidewire through the open-ended catheter, into the right distal ureter and then advanced the open-ended catheter over the guidewire and removed the guidewire.  A right retrograde pyelogram was then performed in the standard fashion using a total of 20 mL of iodinated contrast material at full strength. This was done under direct fluoroscopy. I initially noted no contrast passing beyond the area where the stones were located. I therefore advanced the open-ended catheter to a point just below the stones which was noted by slight resistance to passage of the open-ended catheter. I then withdrew it back about a centimeter and injected contrast again much closer to the stones that still was unable to get any contrast past the stones and into the collecting system.  I passed the guidewire through the open-ended catheter and left this in place. I removed the open-ended catheter and passed the dual-lumen ureteral catheter over the guidewire in order to pass a second guidewire as a safety guidewire. With these in place I gently tried to advance the guidewire and noted that it did easily pass into the lateral aspect and curled somewhat and I thought it might be in the collecting system and therefore removed one of the guidewires and injected further contrast but I noted extravasation and no outlining of the collecting system. I then replaced the second guidewire and left both in place removing the dual-lumen catheter.  I initially passed the digital, flexible ureteroscope over the working guidewire securing the safety guidewire to the drape. I was able to pass this up to an area where there appeared to be some narrowing of the ureter. I was unable to pass this beyond that area.  Under direct vision I tried to pass a guidewire beyond the stones but in fact was never really able to ever identify a stone as it appeared the ureter actually may be completely stenosed down and  obliterated. I then removed the flexible ureteroscope and passed the micro-tipped, rigid ureteroscope up the right ureter and again could not find a lumen, the stone or anything that appeared to be mucosa leading to the location of the stone. There did appear to be a small opening in the ureter where the guidewire had passed outside of the ureter. I tried several times to pass a guidewire under direct vision into various locations but was unsuccessful at gaining access to the collecting system proximal to the stones. I therefore removed the guidewires and ureteroscope, drained the bladder and the patient was awakened and taken to the recovery room in stable and satisfactory condition. She tolerated the procedure well no intraoperative complications.  She will need to undergo percutaneous nephrostomy tube placement followed by possible internalization and eventual renogram.   PLAN OF CARE: Discharge to home after PACU  PATIENT DISPOSITION:  PACU - hemodynamically stable.

## 2016-03-31 NOTE — Anesthesia Procedure Notes (Signed)
Procedure Name: LMA Insertion Date/Time: 03/31/2016 8:38 AM Performed by: Justice Rocher Pre-anesthesia Checklist: Patient identified, Emergency Drugs available, Suction available and Patient being monitored Patient Re-evaluated:Patient Re-evaluated prior to inductionOxygen Delivery Method: Circle System Utilized Preoxygenation: Pre-oxygenation with 100% oxygen Intubation Type: IV induction Ventilation: Mask ventilation without difficulty LMA: LMA inserted LMA Size: 4.0 Number of attempts: 1 Airway Equipment and Method: Bite block Placement Confirmation: positive ETCO2 Tube secured with: Tape Dental Injury: Teeth and Oropharynx as per pre-operative assessment

## 2016-03-31 NOTE — Anesthesia Postprocedure Evaluation (Signed)
Anesthesia Post Note  Patient: Sarah Watson  Procedure(s) Performed: Procedure(s) (LRB): CYSTOSCOPY WITH RIGHT RETROGRADE PYELOGRAM, URETEROSCOPY  (Right)  Patient location during evaluation: PACU Anesthesia Type: General Level of consciousness: sedated Pain management: pain level controlled Vital Signs Assessment: post-procedure vital signs reviewed and stable Respiratory status: spontaneous breathing and respiratory function stable Cardiovascular status: stable Anesthetic complications: no    Last Vitals:  Filed Vitals:   03/31/16 0959 03/31/16 1000  BP: 101/72   Pulse: 76   Temp:    Resp: 15 15    Last Pain:  Filed Vitals:   03/31/16 1008  PainSc: 2                  Yacine Garriga DANIEL

## 2016-03-31 NOTE — Transfer of Care (Signed)
Immediate Anesthesia Transfer of Care Note  Patient: Sarah Watson  Procedure(s) Performed: Procedure(s) (LRB): CYSTOSCOPY WITH RIGHT RETROGRADE PYELOGRAM, URETEROSCOPY  (Right)  Patient Location: PACU  Anesthesia Type: General  Level of Consciousness: awake, sedated, patient cooperative and responds to stimulation  Airway & Oxygen Therapy: Patient Spontanous Breathing and Patient connected to face mask oxygen  Post-op Assessment: Report given to PACU RN, Post -op Vital signs reviewed and stable and Patient moving all extremities  Post vital signs: Reviewed and stable  Complications: No apparent anesthesia complications

## 2016-03-31 NOTE — Discharge Instructions (Signed)

## 2016-03-31 NOTE — H&P (Signed)
History of Present Illness Sarah Watson is a 50 year old female seen in follow-up from the emergency room for right renal obstruction and bilateral renal calculi.    She was seen in the ER on 02/21/16 with vague moderate right upper and lower abdominal pain with anorexia. At the time her urine appeared infected. She said she began having pain in her back in 3/17. A CT scan was obtained and revealed bilateral renal calculi. She has several stones on the left that appeared to possibly be obstructing the infundibului that they are associated with. On the right hand side she has marked hydronephrosis with thinning of the parenchyma and a perinephric fluid collection displacing the kidney anteriorly with multiple loculations suggesting a possible forniceal rupture or perinephric hematoma. She has several very large stones located in the area of the right renal pelvis/UPJ with Hounsfield units of approximately 650. I note her urine appeared infected at the time of her ER visit and also one year previously although no culture appears to have been performed but this may have indicated the presence of her stones at that time. She denies any history of UTIs. She has had progressive weight loss. She has never passed a kidney stone   Past Medical History Problems  1. History of Ectopic pregnancy (O00.90) 2. History of anemia (Z86.2) 3. History of eczema (Z87.2) 4. History of gestational diabetes mellitus (GDM) (Z86.32) 5. History of renal failure (Z87.448) 6. History of NSVD (normal spontaneous vaginal delivery) (O80) 7. History of Oligomenorrhea (N91.5)  Surgical History Problems  1. History of Complete Colonoscopy 2. History of Esophagogastroduodenoscopy 3. History of Excision Of Neuroma 4. History of Salpingectomy 5. History of Tubal Ligation  Current Meds 1. [place] Greaseless 10-15 % External Cream;  Therapy: (Recorded:18May2017) to Recorded 2. Ciprofloxacin HCl - 500 MG Oral Tablet;  Therapy:  (Recorded:18May2017) to Recorded 3. Ferrous Sulfate 325 (65 Fe) MG Oral Tablet;  Therapy: (Recorded:18May2017) to Recorded 4. Ibuprofen 200 MG Oral Tablet;  Therapy: (Recorded:18May2017) to Recorded 5. Multi Vitamin/Minerals TABS;  Therapy: (Recorded:18May2017) to Recorded 6. Vitamin D 1000 UNIT Oral Tablet;  Therapy: (Recorded:18May2017) to Recorded  Allergies Medication  1. Penicillins  Family History Problems  1. Family history of diabetes mellitus (Z83.3) : Maternal Aunt 2. Family history of malignant neoplasm of prostate [id number]) : Paternal Grandfather 3. No pertinent family history : Mother  Social History Problems    Denied: History of Alcohol use   Denied: History of Caffeine use   Married   Never a smoker   Number of children   Occupation  Review of Systems Genitourinary, constitutional, skin, eye, otolaryngeal, hematologic/lymphatic, cardiovascular, pulmonary, endocrine, musculoskeletal, gastrointestinal, neurological and psychiatric system(s) were reviewed and pertinent findings if present are noted and are otherwise negative.  Genitourinary: nocturia.  Constitutional: night sweats, feeling tired (fatigue) and recent weight loss, but no fever.  Integumentary: pruritus.  Musculoskeletal: back pain.  Psychiatric: depression and anxiety.    Vitals Vital Signs [Data Includes: Last 1 Day]  Recorded: [id number] 12:47PM  Height: 5 ft 3 in Blood Pressure: 86 / 64 Temperature: 98.1 F Heart Rate: 93 Patient Refused Weight: Yes  Physical Exam Constitutional: Well nourished and well developed . No acute distress.   ENT:. The ears and nose are normal in appearance.   Neck: The appearance of the neck is normal and no neck mass is present.   Pulmonary: No respiratory distress and normal respiratory rhythm and effort.   Cardiovascular: Heart rate and rhythm are normal . No peripheral edema.  Abdomen: The abdomen is soft and nontender. No masses are palpated.  No CVA tenderness. No hernias are palpable. No hepatosplenomegaly noted.   Lymphatics: The femoral and inguinal nodes are not enlarged or tender.   Skin: Normal skin turgor, no visible rash and no visible skin lesions.   Neuro/Psych:. Mood and affect are appropriate.    Results/Data Urine [Data Includes: Last 1 Day]   [id number]  COLOR YELLOW   APPEARANCE CLEAR   SPECIFIC GRAVITY 1.020   pH 6.0   GLUCOSE NEGATIVE   BILIRUBIN NEGATIVE   KETONE TRACE   BLOOD 1+   PROTEIN TRACE   NITRITE NEGATIVE   LEUKOCYTE ESTERASE 1+   SQUAMOUS EPITHELIAL/HPF 0-5 HPF  WBC 6-10 WBC/HPF  RBC 3-10 RBC/HPF  BACTERIA FEW HPF  CRYSTALS NONE SEEN HPF  CASTS NONE SEEN LPF  Yeast NONE SEEN HPF   Old records or history reviewed: ER notes as above.  The following images/tracing/specimen were independently visualized:  CT scan as above.  The following clinical lab reports were reviewed:  UA: Red cells were noted but was otherwise free of any sign of infection.  The following radiology reports were reviewed: CT scan.    Assessment Assessed  1. Perinephric hematoma (S37.019A) 2. Hydronephrosis, right (N13.30) 3. Renal calculus, bilateral (N20.0)  I discussed with her the fact that she has bilateral renal calculi although the greater problem was on the right-hand side with the stones having resulted in severe hydronephrosis that appears to be long-standing with fairly significant loss of renal parenchyma. In addition she has a large fluid collection posterior to the kidney and a slight elevation of her creatinine at 1.44. I told her that it is possible there may be significant enough renal function remaining on the right-hand side to warrant salvage of her kidney however that needs to be determined. What I have recommended is that I attempt to place a stent beyond the stones and into her right kidney, if possible, to decompress the kidney and then proceed with a renogram in order to determine the degree of  function of her kidney. In addition this could potentially allow the fluid collection posterior to her kidney to resolve. I told her that it is possible that I may not be able to get a stent passed the stones on the right-hand side and in that case a percutaneous nephrostomy tube may be necessary.  We did also discuss not decompressing her right kidney and the long-term effects and possible complications that could occur if she chose to proceed in this fashion.  We al could discussed the fact that she has left renal calculi that appear to have instructed her infundibula in several locations but overall the kidney appears to be functioning well.   Plan Hydronephrosis, right, Perinephric hematoma, Renal calculus, bilateral  1. Follow-up Schedule Surgery Office  Follow-up  Status: Hold For - Appointment   Requested for: [id number]  1. Cystoscopy with right retrograde pyelogram and attempted right double-J stent placement.  2. Once her kidney is decompressed I will obtain a MAG3 renogram at approximately 4 weeks and then have her return to go over those results.   Signatures Electronically signed by : Kathie Rhodes, M.D.; Mar 30 2016  1:18PM EST Artist)

## 2016-04-03 ENCOUNTER — Other Ambulatory Visit: Payer: Self-pay | Admitting: Radiology

## 2016-04-03 ENCOUNTER — Encounter (HOSPITAL_BASED_OUTPATIENT_CLINIC_OR_DEPARTMENT_OTHER): Payer: Self-pay | Admitting: Urology

## 2016-04-04 ENCOUNTER — Ambulatory Visit (HOSPITAL_COMMUNITY)
Admission: RE | Admit: 2016-04-04 | Discharge: 2016-04-04 | Disposition: A | Discharge status: Home / Self Care | Payer: BC Managed Care – PPO | Source: Ambulatory Visit | Attending: Urology | Admitting: Urology

## 2016-04-04 ENCOUNTER — Other Ambulatory Visit (HOSPITAL_COMMUNITY): Payer: BC Managed Care – PPO

## 2016-04-04 ENCOUNTER — Encounter (HOSPITAL_COMMUNITY): Payer: Self-pay

## 2016-04-04 ENCOUNTER — Other Ambulatory Visit: Payer: Self-pay | Admitting: Urology

## 2016-04-04 DIAGNOSIS — R300 Dysuria: Secondary | ICD-10-CM | POA: Insufficient documentation

## 2016-04-04 DIAGNOSIS — N1330 Unspecified hydronephrosis: Secondary | ICD-10-CM

## 2016-04-04 DIAGNOSIS — N135 Crossing vessel and stricture of ureter without hydronephrosis: Secondary | ICD-10-CM | POA: Diagnosis present

## 2016-04-04 DIAGNOSIS — Z880 Allergy status to penicillin: Secondary | ICD-10-CM | POA: Diagnosis not present

## 2016-04-04 DIAGNOSIS — N132 Hydronephrosis with renal and ureteral calculous obstruction: Secondary | ICD-10-CM | POA: Insufficient documentation

## 2016-04-04 DIAGNOSIS — D509 Iron deficiency anemia, unspecified: Secondary | ICD-10-CM | POA: Insufficient documentation

## 2016-04-04 DIAGNOSIS — N200 Calculus of kidney: Secondary | ICD-10-CM

## 2016-04-04 DIAGNOSIS — Z8632 Personal history of gestational diabetes: Secondary | ICD-10-CM | POA: Diagnosis not present

## 2016-04-04 LAB — BASIC METABOLIC PANEL
Anion gap: 5 (ref 5–15)
BUN: 15 mg/dL (ref 6–20)
CHLORIDE: 103 mmol/L (ref 101–111)
CO2: 28 mmol/L (ref 22–32)
Calcium: 8.9 mg/dL (ref 8.9–10.3)
Creatinine, Ser: 1.14 mg/dL — ABNORMAL HIGH (ref 0.44–1.00)
GFR calc non Af Amer: 56 mL/min — ABNORMAL LOW (ref >60)
Glucose, Bld: 90 mg/dL (ref 65–99)
POTASSIUM: 4.1 mmol/L (ref 3.5–5.1)
SODIUM: 136 mmol/L (ref 135–145)

## 2016-04-04 LAB — CBC WITH DIFFERENTIAL/PLATELET
BASOS ABS: 0.1 10*3/uL (ref 0.0–0.1)
Basophils Relative: 1 %
EOS ABS: 0.1 10*3/uL (ref 0.0–0.7)
Eosinophils Relative: 1 %
HCT: 30.6 % — ABNORMAL LOW (ref 36.0–46.0)
Hemoglobin: 9.9 g/dL — ABNORMAL LOW (ref 12.0–15.0)
LYMPHS ABS: 2.2 10*3/uL (ref 0.7–4.0)
LYMPHS PCT: 21 %
MCH: 29.5 pg (ref 26.0–34.0)
MCHC: 32.4 g/dL (ref 30.0–36.0)
MCV: 91.1 fL (ref 78.0–100.0)
Monocytes Absolute: 0.7 10*3/uL (ref 0.1–1.0)
Monocytes Relative: 7 %
NEUTROS PCT: 70 %
Neutro Abs: 7.6 10*3/uL (ref 1.7–7.7)
PLATELETS: 545 10*3/uL — AB (ref 150–400)
RBC: 3.36 MIL/uL — AB (ref 3.87–5.11)
RDW: 14.5 % (ref 11.5–15.5)
WBC: 10.6 10*3/uL — AB (ref 4.0–10.5)

## 2016-04-04 LAB — PROTIME-INR
INR: 1.03 (ref 0.00–1.49)
PROTHROMBIN TIME: 13.7 s (ref 11.6–15.2)

## 2016-04-04 MED ORDER — IOPAMIDOL (ISOVUE-300) INJECTION 61%
10.0000 mL | Freq: Once | INTRAVENOUS | Status: AC | PRN
  Administered 2016-04-04: 10 mL

## 2016-04-04 MED ORDER — MIDAZOLAM HCL 2 MG/2ML IJ SOLN
INTRAMUSCULAR | Status: AC
  Filled 2016-04-04: qty 2

## 2016-04-04 MED ORDER — CIPROFLOXACIN IN D5W 400 MG/200ML IV SOLN
400.0000 mg | INTRAVENOUS | Status: AC
  Administered 2016-04-04: 400 mg via INTRAVENOUS

## 2016-04-04 MED ORDER — LIDOCAINE HCL 1 % IJ SOLN
INTRAMUSCULAR | Status: AC | PRN
  Administered 2016-04-04: 10 mL

## 2016-04-04 MED ORDER — MIDAZOLAM HCL 2 MG/2ML IJ SOLN
INTRAMUSCULAR | Status: AC | PRN
  Administered 2016-04-04 (×4): 1 mg via INTRAVENOUS

## 2016-04-04 MED ORDER — CIPROFLOXACIN IN D5W 400 MG/200ML IV SOLN
INTRAVENOUS | Status: AC
  Administered 2016-04-04: 400 mg via INTRAVENOUS
  Filled 2016-04-04: qty 200

## 2016-04-04 MED ORDER — LIDOCAINE HCL 1 % IJ SOLN
INTRAMUSCULAR | Status: AC
  Filled 2016-04-04: qty 20

## 2016-04-04 MED ORDER — KETOROLAC TROMETHAMINE 30 MG/ML IJ SOLN: 30.0000 mg | Freq: Once | INTRAMUSCULAR | Status: DC

## 2016-04-04 MED ORDER — DIPHENHYDRAMINE HCL 50 MG/ML IJ SOLN
12.5000 mg | Freq: Once | INTRAMUSCULAR | Status: AC
  Administered 2016-04-04: 12.5 mg via INTRAVENOUS
  Filled 2016-04-04: qty 1

## 2016-04-04 MED ORDER — FENTANYL CITRATE (PF) 100 MCG/2ML IJ SOLN
INTRAMUSCULAR | Status: AC | PRN
  Administered 2016-04-04 (×4): 50 ug via INTRAVENOUS

## 2016-04-04 MED ORDER — MIDAZOLAM HCL 2 MG/2ML IJ SOLN
INTRAMUSCULAR | Status: DC
Start: 2016-04-04 — End: 2016-04-05
  Filled 2016-04-04: qty 2

## 2016-04-04 MED ORDER — SODIUM CHLORIDE 0.9 % IV SOLN
INTRAVENOUS | Status: DC
  Administered 2016-04-04: 10:00:00 via INTRAVENOUS

## 2016-04-04 MED ORDER — KETOROLAC TROMETHAMINE 30 MG/ML IJ SOLN
15.0000 mg | Freq: Once | INTRAMUSCULAR | Status: DC
  Filled 2016-04-04: qty 1

## 2016-04-04 MED ORDER — FENTANYL CITRATE (PF) 100 MCG/2ML IJ SOLN
INTRAMUSCULAR | Status: AC
  Filled 2016-04-04: qty 2

## 2016-04-04 MED ORDER — KETOROLAC TROMETHAMINE 15 MG/ML IJ SOLN
15.0000 mg | Freq: Once | INTRAMUSCULAR | Status: AC
  Administered 2016-04-04: 15 mg via INTRAVENOUS
  Filled 2016-04-04: qty 1

## 2016-04-04 NOTE — Progress Notes (Addendum)
Pt Itching to rt arm/rt knee resolved (redness and bumps gone) and pain rt flank has resolved.  Pt getting ready for d/c home.  Dr.Wagner spoke to pt's sister over the phone regarding pt procedure and pt's sister's questions were answered.

## 2016-04-04 NOTE — H&P (Signed)
Chief Complaint: right ureteral obstruction  Referring Physician:Dr. Kathie Rhodes  Supervising Physician: Corrie Mckusick  Patient Status:  Out-pt  HPI: Sarah Watson is an 50 y.o. female who has a long-standing history of high-grade right ureteral obstruction secondary to nephrolithiasis.  She underwent a recent cystoscopy with retrograde pyelogram; however, access was unable to be obtained secondary to obstruction.  The procedure was terminated and an order was given for a (R) PCN to be placed to relieve this obstruction.  The patient still complains of some dysuria secondary to the above procedure, but otherwise no new complaints.  Past Medical History:  Past Medical History  Diagnosis Date  . Eczema   . Iron deficiency anemia     CHRONIC -- MONITORED BY DR [facility] ([facility])--  HX IV IRON INFUSION  . History of ectopic pregnancy   . Right ureteral stone   . History of gestational diabetes   . Nocturia   . Nephrolithiasis     BILATERAL --  PER CT 02-21-2016 RIGHT RENAL CALCULI OBSTRUCTIVE /  LEFT RENAL CALCULI NONOBSTRUCTIVE  . Hydronephrosis, right     Past Surgical History:  Past Surgical History  Procedure Laterality Date  . Colonoscopy N/A 08/03/2014    Procedure: COLONOSCOPY;  Surgeon: Danie Binder, MD;  Location: [facility];  Service: Endoscopy;  Laterality: N/A;  930  . Esophagogastroduodenoscopy N/A 08/03/2014    Procedure: ESOPHAGOGASTRODUODENOSCOPY (EGD);  Surgeon: Danie Binder, MD;  Location: [facility];  Service: Endoscopy;  Laterality: N/A;  . Acoustic neuroma resection Left 2008  . Laparoscopy for ectopic pregnancy  06-18-2006    RIGHT SALPINGECTOMY  . Laparoscopy left partial salpingectomy  06-29-2009    for desired sterilization  . Cystoscopy w/ ureteral stent placement Right 03/31/2016    Procedure: CYSTOSCOPY WITH RIGHT RETROGRADE PYELOGRAM, URETEROSCOPY ;  Surgeon: Kathie Rhodes, MD;  Location: [facility];  Service:  Urology;  Laterality: Right;    Family History:  Family History  Problem Relation Age of Onset  . Diabetes Paternal Aunt   . Cancer Paternal Grandfather     PROSTATE  . Colon cancer Neg Hx     Social History:  reports that she has never smoked. She has never used smokeless tobacco. She reports that she does not drink alcohol or use illicit drugs.  Allergies:  Allergies  Allergen Reactions  . Penicillins Anaphylaxis    Medications:   Medication List    ASK your doctor about these medications        BEN [place] GREASELESS 10-15 % greaseless cream  Apply 1 application topically 4 (four) times daily as needed for pain.     HYDROcodone-acetaminophen 10-325 MG tablet  Commonly known as:  NORCO  Take 1-2 tablets by mouth every 4 (four) hours as needed for moderate pain. Maximum dose per 24 hours - 8 pills     ibuprofen 200 MG tablet  Commonly known as:  ADVIL,MOTRIN  Take 200 mg by mouth 3 (three) times daily.     mirtazapine 30 MG tablet  Commonly known as:  REMERON  Take 1 tablet (30 mg total) by mouth at bedtime.     phenazopyridine 200 MG tablet  Commonly known as:  PYRIDIUM  Take 1 tablet (200 mg total) by mouth 3 (three) times daily as needed for pain.        Please HPI for pertinent positives, otherwise complete 10 system ROS negative.  Mallampati Score: MD Evaluation Airway: WNL Heart:  WNL Abdomen: WNL Chest/ Lungs: WNL ASA  Classification: 2 Mallampati/Airway Score: Two  Physical Exam: BP 96/63 mmHg  Pulse 91  Temp(Src) 97.8 F (36.6 C) (Oral)  Resp 16  Wt 107 lb 6.4 oz (48.716 kg)  SpO2 100%  LMP 11/20/2013 Body mass index is 19.03 kg/(m^2). General: pleasant, thin black female who is laying in bed in NAD HEENT: head is normocephalic, atraumatic.  Sclera are noninjected.  PERRL.  Ears and nose without any masses or lesions.  Mouth is pink and moist Heart: regular, rate, and rhythm.  Normal s1,s2. No obvious murmurs, gallops, or rubs noted.   Palpable radial and pedal pulses bilaterally Lungs: CTAB, no wheezes, rhonchi, or rales noted.  Respiratory effort nonlabored Abd: soft, NT, ND, +BS, no masses, hernias, or organomegaly MS: all 4 extremities are symmetrical with no cyanosis, clubbing, or edema. Psych: A&Ox3 with an appropriate affect.   Labs: Results for orders placed or performed during the hospital encounter of 04/04/16 (from the past 48 hour(s))  Basic metabolic panel     Status: Abnormal   Collection Time: 04/04/16  9:52 AM  Result Value Ref Range   Sodium 136 135 - 145 mmol/L   Potassium 4.1 3.5 - 5.1 mmol/L   Chloride 103 101 - 111 mmol/L   CO2 28 22 - 32 mmol/L   Glucose, Bld 90 65 - 99 mg/dL   BUN 15 6 - 20 mg/dL   Creatinine, Ser 1.14 (H) 0.44 - 1.00 mg/dL   Calcium 8.9 8.9 - 10.3 mg/dL   GFR calc non Af Amer 56 (L) >60 mL/min   GFR calc Af Amer >60 >60 mL/min    Comment: (NOTE) The eGFR has been calculated using the CKD EPI equation. This calculation has not been validated in all clinical situations. eGFR's persistently <60 mL/min signify possible Chronic Kidney Disease.    Anion gap 5 5 - 15  CBC with Differential/Platelet     Status: Abnormal   Collection Time: 04/04/16  9:52 AM  Result Value Ref Range   WBC 10.6 (H) 4.0 - 10.5 K/uL   RBC 3.36 (L) 3.87 - 5.11 MIL/uL   Hemoglobin 9.9 (L) 12.0 - 15.0 g/dL   HCT 30.6 (L) 36.0 - 46.0 %   MCV 91.1 78.0 - 100.0 fL   MCH 29.5 26.0 - 34.0 pg   MCHC 32.4 30.0 - 36.0 g/dL   RDW 14.5 11.5 - 15.5 %   Platelets 545 (H) 150 - 400 K/uL   Neutrophils Relative % 70 %   Neutro Abs 7.6 1.7 - 7.7 K/uL   Lymphocytes Relative 21 %   Lymphs Abs 2.2 0.7 - 4.0 K/uL   Monocytes Relative 7 %   Monocytes Absolute 0.7 0.1 - 1.0 K/uL   Eosinophils Relative 1 %   Eosinophils Absolute 0.1 0.0 - 0.7 K/uL   Basophils Relative 1 %   Basophils Absolute 0.1 0.0 - 0.1 K/uL  Protime-INR     Status: None   Collection Time: 04/04/16  9:52 AM  Result Value Ref Range    Prothrombin Time 13.7 11.6 - 15.2 seconds   INR 1.03 0.00 - 1.49    Imaging: No results found.  Assessment/Plan 1. Right ureteral obstruction secondary to nephrolithiasis -we will plan on placement of a right PCN drain today to help relieve this obstruction. -labs and vitals have been reviewed and are normal. -Risks and Benefits discussed with the patient including, but not limited to infection, bleeding, significant bleeding causing loss or  decrease in renal function or damage to adjacent structures.  All of the patient's questions were answered, patient is agreeable to proceed. Consent signed and in chart.   Thank you for this interesting consult.  I greatly enjoyed meeting Emile Ringgenberg and look forward to participating in their care.  A copy of this report was sent to the requesting provider on this date.  Electronically Signed: Henreitta Cea 04/04/2016, 10:48 AM   I spent a total of  30 Minutes   in face to face in clinical consultation, greater than 50% of which was counseling/coordinating care for right ureteral obstruction, place PCN

## 2016-04-04 NOTE — Progress Notes (Signed)
Pt c/o  Itching rt upper arm and behind rt knee.  Removed bp cuff from rt arm, sister states she is very sensitive and breaks out easy.  Pt scratching and clawing at areas.  Small bumps noted on rt arm and behind rt knee.  MD Earleen Newport made aware, benadryl order given.  Pt c/o pain 5/10 rt flank, MD Earleen Newport made aware, Toradol order received.

## 2016-04-04 NOTE — Discharge Instructions (Signed)
Moderate Conscious Sedation, Adult, Care After Refer to this sheet in the next few weeks. These instructions provide you with information on caring for yourself after your procedure. Your health care provider may also give you more specific instructions. Your treatment has been planned according to current medical practices, but problems sometimes occur. Call your health care provider if you have any problems or questions after your procedure. WHAT TO EXPECT AFTER THE PROCEDURE  After your procedure:  You may feel sleepy, clumsy, and have poor balance for several hours.  Vomiting may occur if you eat too soon after the procedure. HOME CARE INSTRUCTIONS  Do not participate in any activities where you could become injured for at least 24 hours. Do not:  Drive.  Swim.  Ride a bicycle.  Operate heavy machinery.  Cook.  Use power tools.  Climb ladders.  Work from a high place.  Do not make important decisions or sign legal documents until you are improved.  If you vomit, drink water, juice, or soup when you can drink without vomiting. Make sure you have little or no nausea before eating solid foods.  Only take over-the-counter or prescription medicines for pain, discomfort, or fever as directed by your health care provider.  Make sure you and your family fully understand everything about the medicines given to you, including what side effects may occur.  You should not drink alcohol, take sleeping pills, or take medicines that cause drowsiness for at least 24 hours.  If you smoke, do not smoke without supervision.  If you are feeling better, you may resume normal activities 24 hours after you were sedated.  Keep all appointments with your health care provider. SEEK MEDICAL CARE IF:  Your skin is pale or bluish in color.  You continue to feel nauseous or vomit.  Your pain is getting worse and is not helped by medicine.  You have bleeding or swelling.  You are still  sleepy or feeling clumsy after 24 hours. SEEK IMMEDIATE MEDICAL CARE IF:  You develop a rash.  You have difficulty breathing.  You develop any type of allergic problem.  You have a fever. MAKE SURE YOU:  Understand these instructions.  Will watch your condition.  Will get help right away if you are not doing well or get worse.   This information is not intended to replace advice given to you by your health care provider. Make sure you discuss any questions you have with your health care provider.   Document Released: 08/20/2013 Document Revised: 11/20/2014 Document Reviewed: 08/20/2013 Elsevier Interactive Patient Education 2016 [place]  An incision (cut) is when a surgeon cuts into your body. After surgery, the cut needs to be well cared for to keep it from getting infected.  HOW TO CARE FOR YOUR CUT  Take medicines only as told by your doctor.  There are many different ways to close and cover a cut, including stitches, skin glue, and adhesive strips. Follow your doctor's instructions on:  Care of the cut.  Bandage (dressing) changes and removal.  Cut closure removal.  Do not take baths, swim, or use a hot tub until your doctor says it is okay. You may shower as told by your doctor.  Return to your normal diet and activities as allowed by your doctor.  Use medicine that helps lessen itching on your cut as told by your doctor. Do not pick or scratch at your cut.  Drink enough fluids to keep your pee (urine)  clear or pale yellow. GET HELP IF:  You have redness, puffiness (swelling), or pain at the site of your cut.  You have fluid, blood, or pus coming from your cut.  Your muscles ache.  You have chills or you feel sick.  You have a bad smell coming from the cut or bandage.  Your cut opens up after stitches, staples, or adhesive strips have been removed.  You keep feeling sick to your stomach (nauseous) or keep throwing up  (vomiting).  You have a fever.  You are dizzy. GET HELP RIGHT AWAY IF:  You have a rash.  You pass out (faint).  You have trouble breathing. MAKE SURE YOU:   Understand these instructions.  Will watch your condition.  Will get help right away if you are not doing well or get worse.   This information is not intended to replace advice given to you by your health care provider. Make sure you discuss any questions you have with your health care provider.   Document Released: 01/22/2012 Document Revised: 11/20/2014 Document Reviewed: 12/24/2013 Elsevier Interactive Patient Education [employer].

## 2016-04-04 NOTE — Procedures (Signed)
Interventional Radiology Procedure Note  Procedure: Attempted right PCN, for culture and for possible recovery of kidney function.    Fluid was aspirated for culture, ~3-4cc of viscous, blood-tinged yellow fluid.   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  Disruption of the usual kidney anatomy, with echogenic material within the calyx and collecting system.  No discernable infundibulum/calyx configuration for safe drain/PCN placement.  US/Fluoro guidance for puncture, with no definable architecture.   Aspiration of fluid for culture.     Complications: None  Recommendations:  - 90 minutes of observation. - Follow up with Dr. Karsten Ro - If a future drain is required of the left collecting system or perinephric space, CT guidance would be required.  - Follow up cultures.   Signed,  Dulcy Fanny. Earleen Newport, DO

## 2016-04-09 LAB — AEROBIC/ANAEROBIC CULTURE W GRAM STAIN (SURGICAL/DEEP WOUND): Culture: NO GROWTH

## 2016-04-11 ENCOUNTER — Other Ambulatory Visit (HOSPITAL_BASED_OUTPATIENT_CLINIC_OR_DEPARTMENT_OTHER): Payer: BC Managed Care – PPO

## 2016-04-11 DIAGNOSIS — D509 Iron deficiency anemia, unspecified: Secondary | ICD-10-CM

## 2016-04-11 DIAGNOSIS — R634 Abnormal weight loss: Secondary | ICD-10-CM | POA: Diagnosis not present

## 2016-04-11 LAB — CBC WITH DIFFERENTIAL/PLATELET
BASO%: 0.7 % (ref 0.0–2.0)
Basophils Absolute: 0.1 10*3/uL (ref 0.0–0.1)
EOS ABS: 0.1 10*3/uL (ref 0.0–0.5)
EOS%: 0.5 % (ref 0.0–7.0)
HCT: 33.7 % — ABNORMAL LOW (ref 34.8–46.6)
HGB: 10.5 g/dL — ABNORMAL LOW (ref 11.6–15.9)
LYMPH%: 20.6 % (ref 14.0–49.7)
MCH: 29.2 pg (ref 25.1–34.0)
MCHC: 31.2 g/dL — ABNORMAL LOW (ref 31.5–36.0)
MCV: 93.6 fL (ref 79.5–101.0)
MONO#: 0.4 10*3/uL (ref 0.1–0.9)
MONO%: 4.7 % (ref 0.0–14.0)
NEUT%: 73.5 % (ref 38.4–76.8)
NEUTROS ABS: 6.7 10*3/uL — AB (ref 1.5–6.5)
NRBC: 0 % (ref 0–0)
Platelets: 562 10*3/uL — ABNORMAL HIGH (ref 145–400)
RBC: 3.6 10*6/uL — AB (ref 3.70–5.45)
RDW: 14.7 % — AB (ref 11.2–14.5)
WBC: 9.1 10*3/uL (ref 3.9–10.3)
lymph#: 1.9 10*3/uL (ref 0.9–3.3)

## 2016-04-11 LAB — IRON AND TIBC
%SAT: 22 % (ref 21–57)
IRON: 46 ug/dL (ref 41–142)
TIBC: 214 ug/dL — AB (ref 236–444)
UIBC: 168 ug/dL (ref 120–384)

## 2016-04-11 LAB — TSH: TSH: 1.731 m(IU)/L (ref 0.308–3.960)

## 2016-04-11 LAB — FERRITIN

## 2016-04-18 ENCOUNTER — Ambulatory Visit (HOSPITAL_BASED_OUTPATIENT_CLINIC_OR_DEPARTMENT_OTHER): Payer: BC Managed Care – PPO | Admitting: Internal Medicine

## 2016-04-18 ENCOUNTER — Encounter: Payer: Self-pay | Admitting: Internal Medicine

## 2016-04-18 ENCOUNTER — Telehealth: Payer: Self-pay | Admitting: Internal Medicine

## 2016-04-18 VITALS — BP 108/68 | HR 78 | Temp 98.0°F | Resp 20 | Ht 63.0 in | Wt 112.0 lb

## 2016-04-18 DIAGNOSIS — D509 Iron deficiency anemia, unspecified: Secondary | ICD-10-CM

## 2016-04-18 DIAGNOSIS — D649 Anemia, unspecified: Secondary | ICD-10-CM | POA: Diagnosis not present

## 2016-04-18 NOTE — Progress Notes (Signed)
[place] [phone]   [phone]  OFFICE PROGRESS NOTE  Purvis Kilts, MD [place] [id number]  DIAGNOSIS: Persistent anemia questionable for anemia of chronic disease plus/minus iron deficiency  PRIOR THERAPY: Feraheme infusion 4 doses, last dose was given 02/11/2016.  CURRENT THERAPY: None.  INTERVAL HISTORY: Sarah Watson 50 y.o. female returns to the clinic today for follow-up visit. The patient is feeling fine today and gained few pounds since her last visit. She is tolerating the oral iron tablets fairly well. She was found recently to have kidney stone and she is currently evaluated by urology. She denied having any dizzy spells. She has no significant chest pain, shortness of breath, cough or hemoptysis. She has no nausea or vomiting, no fever or chills. She has repeat CBC, iron study and ferritin performed recently and she is here for evaluation and discussion of her lab results.  MEDICAL HISTORY: Past Medical History  Diagnosis Date  . Eczema   . Iron deficiency anemia     CHRONIC -- MONITORED BY DR [facility] ([facility])--  HX IV IRON INFUSION  . History of ectopic pregnancy   . Right ureteral stone   . History of gestational diabetes   . Nocturia   . Nephrolithiasis     BILATERAL --  PER CT 02-21-2016 RIGHT RENAL CALCULI OBSTRUCTIVE /  LEFT RENAL CALCULI NONOBSTRUCTIVE  . Hydronephrosis, right     ALLERGIES:  is allergic to penicillins.  MEDICATIONS:  Current Outpatient Prescriptions  Medication Sig Dispense Refill  . ferrous sulfate 325 (65 FE) MG tablet Take 325 mg by mouth daily. Reported on 04/18/2016  5  . HYDROcodone-acetaminophen (NORCO) 10-325 MG tablet Take 1-2 tablets by mouth every 4 (four) hours as needed for moderate pain. Maximum dose per 24 hours - 8 pills (Patient not taking: Reported on 04/18/2016) 8 tablet 0  . ibuprofen (ADVIL,MOTRIN) 200 MG tablet Take 200 mg by mouth 3  (three) times daily. Reported on 04/18/2016    . mirtazapine (REMERON) 30 MG tablet Take 1 tablet (30 mg total) by mouth at bedtime. (Patient not taking: Reported on 04/18/2016) 30 tablet 2   No current facility-administered medications for this visit.    SURGICAL HISTORY:  Past Surgical History  Procedure Laterality Date  . Colonoscopy N/A 08/03/2014    Procedure: COLONOSCOPY;  Surgeon: Danie Binder, MD;  [facility];  Service: Endoscopy;  Laterality: N/A;  930  . Esophagogastroduodenoscopy N/A 08/03/2014    Procedure: ESOPHAGOGASTRODUODENOSCOPY (EGD);  Surgeon: Danie Binder, MD;  [facility];  Service: Endoscopy;  Laterality: N/A;  . Acoustic neuroma resection Left 2008  . Laparoscopy for ectopic pregnancy  06-18-2006    RIGHT SALPINGECTOMY  . Laparoscopy left partial salpingectomy  06-29-2009    for desired sterilization  . Cystoscopy w/ ureteral stent placement Right 03/31/2016    Procedure: CYSTOSCOPY WITH RIGHT RETROGRADE PYELOGRAM, URETEROSCOPY ;  Surgeon: Kathie Rhodes, MD;  Location: [facility];  Service: Urology;  Laterality: Right;    REVIEW OF SYSTEMS:  A comprehensive review of systems was negative except for: Constitutional: positive for fatigue   PHYSICAL EXAMINATION: General appearance: alert, cooperative, fatigued and no distress Head: Normocephalic, without obvious abnormality, atraumatic Neck: no adenopathy, no JVD, supple, symmetrical, trachea midline and thyroid not enlarged, symmetric, no tenderness/mass/nodules Lymph nodes: Cervical, supraclavicular, and axillary nodes normal. Resp: clear to auscultation bilaterally Back: symmetric, no curvature. ROM normal. No  CVA tenderness. Cardio: regular rate and rhythm, S1, S2 normal, no murmur, click, rub or gallop GI: soft, non-tender; bowel sounds normal; no masses,  no organomegaly Extremities: extremities normal, atraumatic, no cyanosis or edema Neurologic: Alert and oriented X 3,  normal strength and tone. Normal symmetric reflexes. Normal coordination and gait  ECOG PERFORMANCE STATUS: 1 - Symptomatic but completely ambulatory  Blood pressure 108/68, pulse 78, temperature 98 F (36.7 C), temperature source Oral, resp. rate 20, height 5\' 3"  (1.6 m), weight 112 lb (50.803 kg), last menstrual period 11/20/2013, SpO2 100 %.  LABORATORY DATA: Lab Results  Component Value Date   WBC 9.1 04/11/2016   HGB 10.5* 04/11/2016   HCT 33.7* 04/11/2016   MCV 93.6 04/11/2016   PLT 562* 04/11/2016      Chemistry      Component Value Date/Time   NA 136 04/04/2016 0952   NA 140 04/27/2015 1300   K 4.1 04/04/2016 0952   K 4.6 04/27/2015 1300   CL 103 04/04/2016 0952   CO2 28 04/04/2016 0952   CO2 25 04/27/2015 1300   BUN 15 04/04/2016 0952   BUN 12.3 04/27/2015 1300   CREATININE 1.14* 04/04/2016 0952   CREATININE 1.2* 04/27/2015 1300      Component Value Date/Time   CALCIUM 8.9 04/04/2016 0952   CALCIUM 8.7 04/27/2015 1300   ALKPHOS 107 02/21/2016 1334   ALKPHOS 82 04/27/2015 1300   AST 18 02/21/2016 1334   AST 15 04/27/2015 1300   ALT 20 02/21/2016 1334   ALT 13 04/27/2015 1300   BILITOT 0.4 02/21/2016 1334   BILITOT 0.31 04/27/2015 1300       RADIOGRAPHIC STUDIES: Ir Image Guided Drainage Percut Cath  Peritoneal Retroperit  04/04/2016  INDICATION: 50 year old female with a history of imaging diagnosis of xanthogranulomatous pyelonephritis. She has been referred for aspiration/ drainage with possible tube placement and culture. Discussed with Dr. Karsten Ro before the procedure, with the intention of drainage for potential recovery of kidney function. EXAM: IR IMAGE GUIDED DRAINAGE PERCUT CATH  PERITONEAL RETROPERIT MEDICATIONS: The patient is currently admitted to the hospital and receiving intravenous antibiotics. The antibiotics were administered within an appropriate time frame prior to the initiation of the procedure. FLUOROSCOPY TIME:  6 minutes, 30 seconds,  19 mGy ANESTHESIA/SEDATION: Fentanyl 4.0 mcg IV; Versed 200 mg IV 400 mg IV Cipro Moderate Sedation Time:  47 The patient was continuously monitored during the procedure by the interventional radiology nurse under my direct supervision. COMPLICATIONS: None PROCEDURE: Informed written consent was obtained from the patient after a thorough discussion of the procedural risks, benefits and alternatives. All questions were addressed. Maximal Sterile Barrier Technique was utilized including caps, mask, sterile gowns, sterile gloves, sterile drape, hand hygiene and skin antiseptic. A timeout was performed prior to the initiation of the procedure. Patient positioned prone position on the fluoroscopy table. Ultrasound survey of the right flank was performed with images stored and sent to PACs. The patient was then prepped and draped in the usual sterile fashion. 1% lidocaine was used to anesthetize the skin and subcutaneous tissues for local anesthesia. A combination of several needles was used, including 21 gauge Chiba needle, a 22 gauge Accustick needle, 18 gauge trocar needle, and effort to puncture a superior or inferior calyx of the right collecting system. Puncture was attempted with both ultrasound and fluoroscopic guidance, including fluoroscopic attempt to puncture a central stone. Puncture of a superior pole papilla/calyx was performed under ultrasound, with placement of the Accustick system.  Aspiration approximately 3-4 cc of thick, viscous yellow fluid was aspirated for a sample. At no point was recognizable collecting system opacified with any of the collecting system punctures, including the central puncture directly on the stone adjacent to L3 vertebral body. Given that no safe position for the drain could be confirmed secondary to disruption of the normal architecture, drainage attempt was abandoned at this time. Patient tolerated the procedure well and remained hemodynamically stable throughout. No  complications were encountered and no significant blood loss encountered IMPRESSION: Status post image guided aspiration of fluid from presumed superior pole calyx/papilla, with a sample sent to the lab for analysis. Given that the normal right kidney architecture is disrupted, a safe position of drainage catheter could not be confirmed, and attempt for right-sided PCN/drainage tube was abandoned at this time. Signed, Dulcy Fanny. Earleen Newport, DO [facility] Radiology PLAN: If additional attempts for drainage are indicated/desired, CT guidance would be recommended for [facility] further attempt. Electronically Signed   By: Corrie Mckusick D.O.   On: 04/04/2016 17:18    ASSESSMENT AND PLAN: This is a very pleasant 50 years old female with persistent anemia likely secondary to iron deficiency in addition to anemia of chronic disease. The patient also has persistent weight loss secondary to lack of appetite and depression. She received 4 doses of IV infusion with Feraheme infusion in July 2016 and 2 doses recently last week. Her hemoglobin and hematocrit are better compared to a few months ago. I recommended for her to continue on the oral iron tablets for now. For the depression, I asked the patient to continue on Remeron 30 mg by mouth daily at bedtime. The patient would come back for follow-up visit in 3 months for reevaluation with repeat CBC, iron study, and ferritin. She was advised to call immediately if she has any concerning symptoms in the interval. The patient voices understanding of current disease status and treatment options and is in agreement with the current care plan.  All questions were answered. The patient knows to call the clinic with any problems, questions or concerns. We can certainly see the patient much sooner if necessary.  Disclaimer: This note was dictated with voice recognition software. Similar sounding words can inadvertently be transcribed  and may not be corrected upon review.

## 2016-04-18 NOTE — Telephone Encounter (Signed)
cld pt and left pt a message of time & date of appt on 11/29 & 12/6

## 2016-05-12 ENCOUNTER — Ambulatory Visit (HOSPITAL_COMMUNITY): Payer: BC Managed Care – PPO

## 2016-05-22 ENCOUNTER — Ambulatory Visit (HOSPITAL_COMMUNITY)
Admission: RE | Admit: 2016-05-22 | Discharge: 2016-05-22 | Disposition: A | Discharge status: Home / Self Care | Payer: BC Managed Care – PPO | Source: Ambulatory Visit | Attending: Urology | Admitting: Urology

## 2016-05-22 DIAGNOSIS — N200 Calculus of kidney: Secondary | ICD-10-CM

## 2016-05-22 DIAGNOSIS — N1330 Unspecified hydronephrosis: Secondary | ICD-10-CM

## 2016-05-22 DIAGNOSIS — N135 Crossing vessel and stricture of ureter without hydronephrosis: Secondary | ICD-10-CM

## 2016-05-22 DIAGNOSIS — N131 Hydronephrosis with ureteral stricture, not elsewhere classified: Secondary | ICD-10-CM | POA: Diagnosis not present

## 2016-05-22 MED ORDER — TECHNETIUM TC 99M MERTIATIDE
15.3000 | Freq: Once | INTRAVENOUS | Status: AC | PRN
  Administered 2016-05-22: 15.3 via INTRAVENOUS

## 2016-05-23 ENCOUNTER — Telehealth: Payer: Self-pay | Admitting: Internal Medicine

## 2016-05-23 NOTE — Telephone Encounter (Signed)
Printed and mailed out info requested for [employer]

## 2016-10-11 ENCOUNTER — Other Ambulatory Visit: Payer: BC Managed Care – PPO

## 2016-10-18 ENCOUNTER — Ambulatory Visit: Payer: BC Managed Care – PPO | Admitting: Internal Medicine

## 2017-04-30 ENCOUNTER — Other Ambulatory Visit: Payer: Self-pay | Admitting: Gynecology

## 2017-04-30 DIAGNOSIS — Z1231 Encounter for screening mammogram for malignant neoplasm of breast: Secondary | ICD-10-CM

## 2017-05-01 ENCOUNTER — Ambulatory Visit (HOSPITAL_BASED_OUTPATIENT_CLINIC_OR_DEPARTMENT_OTHER): Payer: BC Managed Care – PPO

## 2017-05-31 ENCOUNTER — Ambulatory Visit (HOSPITAL_BASED_OUTPATIENT_CLINIC_OR_DEPARTMENT_OTHER)
Admission: RE | Admit: 2017-05-31 | Discharge: 2017-05-31 | Disposition: A | Discharge status: Home / Self Care | Payer: BC Managed Care – PPO | Source: Ambulatory Visit | Attending: Gynecology | Admitting: Gynecology

## 2017-05-31 DIAGNOSIS — Z1231 Encounter for screening mammogram for malignant neoplasm of breast: Secondary | ICD-10-CM | POA: Insufficient documentation

## 2017-11-19 ENCOUNTER — Ambulatory Visit (HOSPITAL_COMMUNITY)
Admission: RE | Admit: 2017-11-19 | Discharge: 2017-11-19 | Disposition: A | Discharge status: Home / Self Care | Payer: BC Managed Care – PPO | Source: Ambulatory Visit | Attending: Family Medicine | Admitting: Family Medicine

## 2017-11-19 ENCOUNTER — Other Ambulatory Visit (HOSPITAL_COMMUNITY): Payer: Self-pay | Admitting: Family Medicine

## 2017-11-19 DIAGNOSIS — R1903 Right lower quadrant abdominal swelling, mass and lump: Secondary | ICD-10-CM | POA: Diagnosis not present

## 2017-11-19 DIAGNOSIS — N2889 Other specified disorders of kidney and ureter: Secondary | ICD-10-CM | POA: Diagnosis not present

## 2017-11-19 DIAGNOSIS — L02214 Cutaneous abscess of groin: Secondary | ICD-10-CM | POA: Diagnosis not present

## 2017-11-19 DIAGNOSIS — R1031 Right lower quadrant pain: Secondary | ICD-10-CM | POA: Insufficient documentation

## 2017-11-19 DIAGNOSIS — R1901 Right upper quadrant abdominal swelling, mass and lump: Secondary | ICD-10-CM

## 2017-11-19 DIAGNOSIS — N200 Calculus of kidney: Secondary | ICD-10-CM | POA: Diagnosis not present

## 2017-11-19 DIAGNOSIS — N281 Cyst of kidney, acquired: Secondary | ICD-10-CM | POA: Insufficient documentation

## 2017-11-19 LAB — POCT I-STAT CREATININE: Creatinine, Ser: 1.2 mg/dL — ABNORMAL HIGH (ref 0.44–1.00)

## 2017-11-19 MED ORDER — IOPAMIDOL (ISOVUE-300) INJECTION 61%
100.0000 mL | Freq: Once | INTRAVENOUS | Status: AC | PRN
  Administered 2017-11-19: 100 mL via INTRAVENOUS

## 2017-11-20 ENCOUNTER — Other Ambulatory Visit: Payer: Self-pay

## 2017-11-20 ENCOUNTER — Observation Stay (HOSPITAL_COMMUNITY)
Admission: EM | Admit: 2017-11-20 | Discharge: 2017-11-22 | Disposition: A | Discharge status: Home / Self Care | Payer: BC Managed Care – PPO | Attending: Urology | Admitting: Urology

## 2017-11-20 ENCOUNTER — Encounter (HOSPITAL_COMMUNITY): Payer: Self-pay | Admitting: Emergency Medicine

## 2017-11-20 DIAGNOSIS — N200 Calculus of kidney: Secondary | ICD-10-CM | POA: Insufficient documentation

## 2017-11-20 DIAGNOSIS — L02211 Cutaneous abscess of abdominal wall: Secondary | ICD-10-CM | POA: Diagnosis not present

## 2017-11-20 DIAGNOSIS — L0291 Cutaneous abscess, unspecified: Secondary | ICD-10-CM | POA: Diagnosis present

## 2017-11-20 DIAGNOSIS — Z880 Allergy status to penicillin: Secondary | ICD-10-CM | POA: Diagnosis not present

## 2017-11-20 DIAGNOSIS — Z79899 Other long term (current) drug therapy: Secondary | ICD-10-CM | POA: Insufficient documentation

## 2017-11-20 DIAGNOSIS — D509 Iron deficiency anemia, unspecified: Secondary | ICD-10-CM | POA: Insufficient documentation

## 2017-11-20 DIAGNOSIS — Z9181 History of falling: Secondary | ICD-10-CM | POA: Diagnosis not present

## 2017-11-20 DIAGNOSIS — K6812 Psoas muscle abscess: Secondary | ICD-10-CM | POA: Diagnosis not present

## 2017-11-20 DIAGNOSIS — Z87442 Personal history of urinary calculi: Secondary | ICD-10-CM | POA: Diagnosis not present

## 2017-11-20 LAB — URINALYSIS, ROUTINE W REFLEX MICROSCOPIC
BILIRUBIN URINE: NEGATIVE
GLUCOSE, UA: NEGATIVE mg/dL
Hgb urine dipstick: NEGATIVE
KETONES UR: 20 mg/dL — AB
Nitrite: NEGATIVE
PH: 6 (ref 5.0–8.0)
PROTEIN: NEGATIVE mg/dL
Specific Gravity, Urine: 1.03 (ref 1.005–1.030)

## 2017-11-20 LAB — COMPREHENSIVE METABOLIC PANEL
ALT: 17 U/L (ref 14–54)
ANION GAP: 8 (ref 5–15)
AST: 22 U/L (ref 15–41)
Albumin: 3.7 g/dL (ref 3.5–5.0)
Alkaline Phosphatase: 74 U/L (ref 38–126)
BUN: 16 mg/dL (ref 6–20)
CALCIUM: 9.5 mg/dL (ref 8.9–10.3)
CHLORIDE: 102 mmol/L (ref 101–111)
CO2: 26 mmol/L (ref 22–32)
Creatinine, Ser: 1.34 mg/dL — ABNORMAL HIGH (ref 0.44–1.00)
GFR, EST AFRICAN AMERICAN: 52 mL/min — AB (ref >60)
GFR, EST NON AFRICAN AMERICAN: 45 mL/min — AB (ref >60)
Glucose, Bld: 75 mg/dL (ref 65–99)
Potassium: 4.4 mmol/L (ref 3.5–5.1)
Sodium: 136 mmol/L (ref 135–145)
Total Bilirubin: 0.7 mg/dL (ref 0.3–1.2)
Total Protein: 8.5 g/dL — ABNORMAL HIGH (ref 6.5–8.1)

## 2017-11-20 LAB — CBC
HCT: 38.3 % (ref 36.0–46.0)
HEMOGLOBIN: 11.9 g/dL — AB (ref 12.0–15.0)
MCH: 29.2 pg (ref 26.0–34.0)
MCHC: 31.1 g/dL (ref 30.0–36.0)
MCV: 94.1 fL (ref 78.0–100.0)
Platelets: 419 10*3/uL — ABNORMAL HIGH (ref 150–400)
RBC: 4.07 MIL/uL (ref 3.87–5.11)
RDW: 13.4 % (ref 11.5–15.5)
WBC: 9.5 10*3/uL (ref 4.0–10.5)

## 2017-11-20 LAB — I-STAT CG4 LACTIC ACID, ED: Lactic Acid, Venous: 1 mmol/L (ref 0.5–1.9)

## 2017-11-20 LAB — LIPASE, BLOOD: LIPASE: 47 U/L (ref 11–51)

## 2017-11-20 MED ORDER — MORPHINE SULFATE (PF) 4 MG/ML IV SOLN: 2.0000 mg | INTRAVENOUS | Status: DC | PRN

## 2017-11-20 MED ORDER — HYDROCODONE-ACETAMINOPHEN 5-325 MG PO TABS
1.0000 | ORAL_TABLET | ORAL | Status: DC | PRN
  Administered 2017-11-21: 1 via ORAL
  Filled 2017-11-20: qty 1

## 2017-11-20 MED ORDER — ONDANSETRON HCL 4 MG/2ML IJ SOLN: 4.0000 mg | INTRAMUSCULAR | Status: DC | PRN

## 2017-11-20 MED ORDER — DIPHENHYDRAMINE HCL 50 MG/ML IJ SOLN
12.5000 mg | Freq: Four times a day (QID) | INTRAMUSCULAR | Status: DC | PRN
Start: 2017-11-20 — End: 2017-11-22

## 2017-11-20 MED ORDER — ACETAMINOPHEN 325 MG PO TABS: 650.0000 mg | ORAL_TABLET | ORAL | Status: DC | PRN

## 2017-11-20 MED ORDER — SENNOSIDES-DOCUSATE SODIUM 8.6-50 MG PO TABS: 1.0000 | ORAL_TABLET | Freq: Every evening | ORAL | Status: DC | PRN

## 2017-11-20 MED ORDER — DIPHENHYDRAMINE HCL 12.5 MG/5ML PO ELIX: 12.5000 mg | ORAL_SOLUTION | Freq: Four times a day (QID) | ORAL | Status: DC | PRN

## 2017-11-20 MED ORDER — CIPROFLOXACIN HCL 500 MG PO TABS
500.0000 mg | ORAL_TABLET | Freq: Two times a day (BID) | ORAL | Status: DC
  Administered 2017-11-20 – 2017-11-22 (×4): 500 mg via ORAL
  Filled 2017-11-20 (×4): qty 1

## 2017-11-20 MED ORDER — OXYBUTYNIN CHLORIDE 5 MG PO TABS: 5.0000 mg | ORAL_TABLET | Freq: Three times a day (TID) | ORAL | Status: DC | PRN

## 2017-11-20 MED ORDER — ZOLPIDEM TARTRATE 5 MG PO TABS: 5.0000 mg | ORAL_TABLET | Freq: Every evening | ORAL | Status: DC | PRN

## 2017-11-20 NOTE — ED Notes (Signed)
Attempted blood draw x 1 unsuccessfully

## 2017-11-20 NOTE — ED Notes (Signed)
Pt seen at [facility] and told to come here for eval of abcess on abd. Pt in NAD. Denies NVD and fevers.

## 2017-11-20 NOTE — H&P (Signed)
H&P  Chief Complaint: Abdominal wall fluid collection/abscess  History of Present Illness: 52 year old female with a history of right obstructing kidney stones causing nonfunctional right kidney.  In the past, she had undergone attempted retrograde stent placement which was unsuccessful by Dr. Karsten Ro in 2017.  She subsequently had a percutaneous drain placed and no infection was identified.  Since the patient was subsequently found to be asymptomatic after conservative management, the decision was made to just observe her.  Yesterday, she was complaining of abdominal distention, and abdominal mass, and pain for the past 4 months.  Her primary care physician ordered a CT scan which revealed a 13 cm subcutaneous fluid collection/abscess with internal calcifications as well as a right nonfunctional kidney with tracking along the psoas and a possible sinus tract that tracked into the abscess cavity.  The patient herself is nontoxic.  She has minimal complaints.  She is in the emergency department because she was told to go there.  Incidentally, her left kidney was also found to have a cystic mass approximately 3 cm possibly infectious in nature as well.  This also surrounded a left kidney stone.  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  The patient herself has minimal complaints.  She states this abdominl distention has gotten progressively worse over the past couple of months.  She has had no fever.  She is voiding well.  She has no dysuria.  She denies any fevers, chills, nausea, vomiting.  She is afebrile with vital signs stable.  No leukocytosis.  Creatinine was 1.34.  Past Medical History:  Diagnosis Date  . Eczema   . History of ectopic pregnancy   . History of gestational diabetes   . Hydronephrosis, right   . Iron deficiency anemia    CHRONIC -- MONITORED BY DR [facility] ([facility])--  HX IV IRON INFUSION  . Nephrolithiasis    BILATERAL --  PER  CT 02-21-2016 RIGHT RENAL CALCULI OBSTRUCTIVE /  LEFT RENAL CALCULI NONOBSTRUCTIVE  . Nocturia   . Right ureteral stone    Past Surgical History:  Procedure Laterality Date  . ACOUSTIC NEUROMA RESECTION Left 2008  . COLONOSCOPY N/A 08/03/2014   Procedure: COLONOSCOPY;  Surgeon: Danie Binder, MD;  [facility];  Service: Endoscopy;  Laterality: N/A;  930  . CYSTOSCOPY W/ URETERAL STENT PLACEMENT Right 03/31/2016   Procedure: CYSTOSCOPY WITH RIGHT RETROGRADE PYELOGRAM, URETEROSCOPY ;  Surgeon: Kathie Rhodes, MD;  Location: [facility];  Service: Urology;  Laterality: Right;  . ESOPHAGOGASTRODUODENOSCOPY N/A 08/03/2014   Procedure: ESOPHAGOGASTRODUODENOSCOPY (EGD);  Surgeon: Danie Binder, MD;  [facility];  Service: Endoscopy;  Laterality: N/A;  . LAPAROSCOPY FOR ECTOPIC PREGNANCY  06-18-2006   RIGHT SALPINGECTOMY  . LAPAROSCOPY LEFT PARTIAL SALPINGECTOMY  06-29-2009   for desired sterilization    Home Medications:   (Not in a hospital admission) Allergies:  Allergies  Allergen Reactions  . Penicillins Anaphylaxis    Childhood    Family History  Problem Relation Age of Onset  . Diabetes Paternal Aunt   . Cancer Paternal Grandfather        PROSTATE  . Colon cancer Neg Hx    Social History:  reports that  has never smoked. she has never used smokeless tobacco. She reports that she does not drink alcohol or use drugs.  ROS: A complete review of systems was performed.  All systems are negative except for pertinent findings as  noted. ROS   Physical Exam:  Vital signs in last 24 hours: Temp:  [98 F (36.7 C)-98.5 F (36.9 C)] 98 F (36.7 C) (01/08 1341) Pulse Rate:  [56-63] 56 (01/08 1844) Resp:  [16-17] 16 (01/08 1844) BP: (114-124)/(60-78) 114/71 (01/08 1844) SpO2:  [98 %-100 %] 98 % (01/08 1844) Weight:  [68.5 kg (151 lb)] 68.5 kg (151 lb) (01/08 1112) General:  Alert and oriented, No acute distress HEENT: Normocephalic,  atraumatic Neck: No JVD or lymphadenopathy Cardiovascular: Regular rate and rhythm Lungs: Regular rate and effort Abdomen: Soft, nontender, nondistended, there is a right lower abdominal area of fluctuance consistent with a 13 cm subcutaneous fluid/ abscess collection.  There is ver mild tenderness over this area. otherwise benign abdomen Back: No CVA tenderness Extremities: No edema Neurologic: Grossly intact  Laboratory Data:  Results for orders placed or performed during the hospital encounter of 11/20/17 (from the past 24 hour(s))  Lipase, blood     Status: None   Collection Time: 11/20/17 11:40 AM  Result Value Ref Range   Lipase 47 11 - 51 U/L  Comprehensive metabolic panel     Status: Abnormal   Collection Time: 11/20/17 11:40 AM  Result Value Ref Range   Sodium 136 135 - 145 mmol/L   Potassium 4.4 3.5 - 5.1 mmol/L   Chloride 102 101 - 111 mmol/L   CO2 26 22 - 32 mmol/L   Glucose, Bld 75 65 - 99 mg/dL   BUN 16 6 - 20 mg/dL   Creatinine, Ser 1.34 (H) 0.44 - 1.00 mg/dL   Calcium 9.5 8.9 - 10.3 mg/dL   Total Protein 8.5 (H) 6.5 - 8.1 g/dL   Albumin 3.7 3.5 - 5.0 g/dL   AST 22 15 - 41 U/L   ALT 17 14 - 54 U/L   Alkaline Phosphatase 74 38 - 126 U/L   Total Bilirubin 0.7 0.3 - 1.2 mg/dL   GFR calc non Af Amer 45 (L) >60 mL/min   GFR calc Af Amer 52 (L) >60 mL/min   Anion gap 8 5 - 15  CBC     Status: Abnormal   Collection Time: 11/20/17 11:40 AM  Result Value Ref Range   WBC 9.5 4.0 - 10.5 K/uL   RBC 4.07 3.87 - 5.11 MIL/uL   Hemoglobin 11.9 (L) 12.0 - 15.0 g/dL   HCT 38.3 36.0 - 46.0 %   MCV 94.1 78.0 - 100.0 fL   MCH 29.2 26.0 - 34.0 pg   MCHC 31.1 30.0 - 36.0 g/dL   RDW 13.4 11.5 - 15.5 %   Platelets 419 (H) 150 - 400 K/uL  Urinalysis, Routine w reflex microscopic     Status: Abnormal   Collection Time: 11/20/17  3:15 PM  Result Value Ref Range   Color, Urine YELLOW YELLOW   APPearance HAZY (A) CLEAR   Specific Gravity, Urine 1.030 1.005 - 1.030   pH 6.0 5.0 -  8.0   Glucose, UA NEGATIVE NEGATIVE mg/dL   Hgb urine dipstick NEGATIVE NEGATIVE   Bilirubin Urine NEGATIVE NEGATIVE   Ketones, ur 20 (A) NEGATIVE mg/dL   Protein, ur NEGATIVE NEGATIVE mg/dL   Nitrite NEGATIVE NEGATIVE   Leukocytes, UA SMALL (A) NEGATIVE   RBC / HPF 6-30 0 - 5 RBC/hpf   WBC, UA TOO NUMEROUS TO COUNT 0 - 5 WBC/hpf   Bacteria, UA RARE (A) NONE SEEN   Squamous Epithelial / LPF 0-5 (A) NONE SEEN   Mucus PRESENT   I-Stat  CG4 Lactic Acid, ED     Status: None   Collection Time: 11/20/17  6:19 PM  Result Value Ref Range   Lactic Acid, Venous 1.00 0.5 - 1.9 mmol/L   No results found for this or any previous visit (from the past 240 hour(s)). Creatinine: Recent Labs    11/19/17 1724 11/20/17 1140  CREATININE 1.20* 1.34*    Impression/Assessment:  Right nonfunctional kidney Subcutaneous abscess possibly secondary to right renal abscess tracking to the subcutaneous tissue versus a chronic draining urine sinus also draining to that subcutaneous cavity.    Plan:  Start ciprofloxacin 500 mg twice daily.  She may eat tonight as she is not toxic.  N.p.o. at midnight for consideration of possible percutaneous drainage versus consideration of planning for potential open surgical drainage of the fluid.  Marton Redwood, III 11/20/2017, 7:45 PM

## 2017-11-20 NOTE — ED Provider Notes (Signed)
[place] EMERGENCY DEPARTMENT Provider Note   CSN: [id number] Arrival date & time: 11/20/17  1013     History   Chief Complaint Chief Complaint  Patient presents with  . Abscess    HPI Kinzie Wickes is a 52 y.o. female.  The history is provided by the patient and medical records. No language interpreter was used.  Abscess  Associated symptoms: no nausea and no vomiting    Romesha Scherer is a 52 y.o. female  with a PMH of iron deficiency anemia, bilateral nephrolithiasis who presents to the Emergency Department after having abnormal CT abdomen / pelvis as an outpatient done yesterday. She was told to come to ER for further evaluation. She reports having right lower abdominal / flank pain since August. At first, she thought she may have just hit something and bruised the area. She tried taking warm baths and icing the area, but pain persisted. She noticed that the area continued to swell, therefore she saw her primary doctor who ordered CT scan. She endorses feeling hot and cold, but states that temperature has always been normal. Denies night sweats. No nausea, vomiting, back pain, dysuria, urinary urgency / frequency.  Past Medical History:  Diagnosis Date  . Eczema   . History of ectopic pregnancy   . History of gestational diabetes   . Hydronephrosis, right   . Iron deficiency anemia    CHRONIC -- MONITORED BY DR [facility] ([facility])--  HX IV IRON INFUSION  . Nephrolithiasis    BILATERAL --  PER CT 02-21-2016 RIGHT RENAL CALCULI OBSTRUCTIVE /  LEFT RENAL CALCULI NONOBSTRUCTIVE  . Nocturia   . Right ureteral stone     Patient Active Problem List   Diagnosis Date Noted  . Iron deficiency anemia 05/27/2015  . Loss of weight 07/16/2014  . Anemia 03/08/2014  . History of gestational diabetes 06/03/2012    Past Surgical History:  Procedure Laterality Date  . ACOUSTIC NEUROMA RESECTION Left 2008  . COLONOSCOPY N/A 08/03/2014   Procedure:  COLONOSCOPY;  Surgeon: Danie Binder, MD;  Location: [facility];  Service: Endoscopy;  Laterality: N/A;  930  . CYSTOSCOPY W/ URETERAL STENT PLACEMENT Right 03/31/2016   Procedure: CYSTOSCOPY WITH RIGHT RETROGRADE PYELOGRAM, URETEROSCOPY ;  Surgeon: Kathie Rhodes, MD;  Location: [facility];  Service: Urology;  Laterality: Right;  . ESOPHAGOGASTRODUODENOSCOPY N/A 08/03/2014   Procedure: ESOPHAGOGASTRODUODENOSCOPY (EGD);  Surgeon: Danie Binder, MD;  Location: [facility];  Service: Endoscopy;  Laterality: N/A;  . LAPAROSCOPY FOR ECTOPIC PREGNANCY  06-18-2006   RIGHT SALPINGECTOMY  . LAPAROSCOPY LEFT PARTIAL SALPINGECTOMY  06-29-2009   for desired sterilization    OB History    Gravida Para Term Preterm AB Living   4 4 2     2    SAB TAB Ectopic Multiple Live Births           2       Home Medications    Prior to Admission medications   Medication Sig Start Date End Date Taking? Authorizing Provider  HYDROcodone-acetaminophen (NORCO) 10-325 MG tablet Take 1-2 tablets by mouth every 4 (four) hours as needed for moderate pain. Maximum dose per 24 hours - 8 pills Patient not taking: Reported on 04/18/2016 03/31/16   Kathie Rhodes, MD  mirtazapine (REMERON) 30 MG tablet Take 1 tablet (30 mg total) by mouth at bedtime. Patient not taking: Reported on 04/18/2016 05/27/15   Curt Bears, MD    Family History Family  History  Problem Relation Age of Onset  . Diabetes Paternal Aunt   . Cancer Paternal Grandfather        PROSTATE  . Colon cancer Neg Hx     Social History Social History   Tobacco Use  . Smoking status: Never Smoker  . Smokeless tobacco: Never Used  Substance Use Topics  . Alcohol use: No  . Drug use: No     Allergies   Penicillins   Review of Systems Review of Systems  Gastrointestinal: Positive for abdominal distention and abdominal pain. Negative for constipation, diarrhea, nausea and vomiting.  All other systems reviewed and are  negative.    Physical Exam Updated Vital Signs BP 118/60 (BP Location: Right Arm)   Pulse (!) 59   Temp 98 F (36.7 C) (Oral)   Resp 16   Ht 5\' 5"  (1.651 m)   Wt 68.5 kg (151 lb)   LMP 11/20/2013   SpO2 100%   BMI 25.13 kg/m   Physical Exam  Constitutional: She is oriented to person, place, and time. She appears well-developed and well-nourished. No distress.  HENT:  Head: Normocephalic and atraumatic.  Neck: Neck supple.  Cardiovascular: Normal rate, regular rhythm and normal heart sounds.  No murmur heard. Pulmonary/Chest: Effort normal and breath sounds normal. No respiratory distress.  Abdominal: Soft. She exhibits no distension.    No CVA tenderness.  Neurological: She is alert and oriented to person, place, and time.  Skin: Skin is warm and dry.  Nursing note and vitals reviewed.    ED Treatments / Results  Labs (all labs ordered are listed, but only abnormal results are displayed) Labs Reviewed  COMPREHENSIVE METABOLIC PANEL - Abnormal; Notable for the following components:      Result Value   Creatinine, Ser 1.34 (*)    Total Protein 8.5 (*)    GFR calc non Af Amer 45 (*)    GFR calc Af Amer 52 (*)    All other components within normal limits  CBC - Abnormal; Notable for the following components:   Hemoglobin 11.9 (*)    Platelets 419 (*)    All other components within normal limits  URINALYSIS, ROUTINE W REFLEX MICROSCOPIC - Abnormal; Notable for the following components:   APPearance HAZY (*)    Ketones, ur 20 (*)    Leukocytes, UA SMALL (*)    Bacteria, UA RARE (*)    Squamous Epithelial / LPF 0-5 (*)    All other components within normal limits  URINE CULTURE  LIPASE, BLOOD  I-STAT CG4 LACTIC ACID, ED  I-STAT CG4 LACTIC ACID, ED    EKG  EKG Interpretation None       Radiology Ct Abdomen Pelvis W Contrast  Result Date: 11/19/2017 CLINICAL DATA:  Right upper abdominal swelling.  Fall 4 months ago. These results will be called to the  ordering clinician or representative by the Radiology Department at the imaging location. EXAM: CT ABDOMEN AND PELVIS WITH CONTRAST TECHNIQUE: Multidetector CT imaging of the abdomen and pelvis was performed using the standard protocol following bolus administration of intravenous contrast. CONTRAST:  157mL ISOVUE-300 IOPAMIDOL (ISOVUE-300) INJECTION 61% COMPARISON:  02/21/2016 FINDINGS: Lower chest:  Negative Hepatobiliary: No focal liver abnormality.No evidence of biliary obstruction or stone. Pancreas: Full appearance of the uncinate process of the pancreas without differential enhancement or ductal enlargement, stable and likely normal. Spleen: Unremarkable. Adrenals/Urinary Tract: Negative adrenals. The right kidney has significantly atrophied and is poorly enhancing. There is caliceal dilatation with  a small renal pelvis. Perinephric stranding about the right kidney with fluid collection extending throughout the right psoas, along the right pelvic sidewall, and into the subcutaneous lower abdomen where there is a dominant collection measuring 13 x 7 cm. This abscess contains dependent bulky calcifications. Small calcifications at the parenchyma and hilum of the right kidney. The upper right ureter is thickened. No hydroureter. On the left there is a multi septated cystic structure with a stone at its base measuring 7 mm. The cystic area measures up to 3.8 cm. Stone disease has decreased on the left since prior, although the cystic mass is progressed. There is scarring in the left upper pole around a 5 mm calculus. Unremarkable bladder. Stomach/Bowel:  No obstruction. No inflammation. Vascular/Lymphatic: No acute vascular abnormality. No mass or adenopathy. Reproductive:No pathologic findings. Other: No ascites or pneumoperitoneum. Musculoskeletal: No acute or aggressive finding peer IMPRESSION: 1. 13 cm subcutaneous abscess with calcifications in the ventral pelvis that is primarily renal via a sinus tract  spanning the right psoas. 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 Right caliectasis has improved and the right kidney has contracted and is poorly enhancing, correlating with 2017 renogram. 2. Left nephrolithiasis with progressive multi septated cystic mass in the left interpolar kidney measuring ~3 cm, presumably also infectious. 3. Bilateral nephrolithiasis. Electronically Signed   By: Monte Fantasia M.D.   On: 11/19/2017 18:02    Procedures Procedures (including critical care time)  Medications Ordered in ED Medications - No data to display   Initial Impression / Assessment and Plan / ED Course  I have reviewed the triage vital signs and the nursing notes.  Pertinent labs & imaging results that were available during my care of the patient were reviewed by me and considered in my medical decision making (see chart for details).    Damyah Gugel is a 52 y.o. female who presents to ED for right lower abdominal pain / swelling for several months. Seen by PCP who ordered outpatient CT. She received call to come to ER for further workup. On exam, patient is afebrile, hemodynamically stable. Non-toxic appearing. Does have palpable tender mass to the right lower abdomen. CT from yesterday reviewed:  IMPRESSION: 1. 13 cm subcutaneous abscess with calcifications in the ventral pelvis that is primarily renal via a sinus tract spanning the right psoas. 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 Right caliectasis has improved and the right kidney has contracted and is poorly enhancing, correlating with 2017 renogram. 2. Left nephrolithiasis with progressive multi septated cystic mass in the left interpolar kidney measuring ~3 cm, presumably also infectious. 3. Bilateral nephrolithiasis.  [facility], Dr. Gloriann Loan, who recommends transfer  to [facility] ER. Will see patient in ER with likely admission. I spoke with [facility] EDP, Dr. Alvino Chapel, who is aware of patient and need to call urology when patient arrives. Patient updated on plan of care and all questions answered.   Patient seen by and discussed with Dr. Vanita Panda who agrees with treatment plan.    Final Clinical Impressions(s) / ED Diagnoses   Final diagnoses:  None    ED Discharge Orders    None       Ward, Ozella Almond, PA-C 11/20/17 1809    Carmin Muskrat, MD 11/21/17 Benancio Deeds

## 2017-11-20 NOTE — ED Notes (Signed)
Per edp patient not appropriate for vertical 3 section. Patient placed back in lobby until an acute bed opens up

## 2017-11-20 NOTE — ED Triage Notes (Signed)
Pt arrives here after having CT scan that showed 13 cm subcutaneous abscess yesterday.

## 2017-11-20 NOTE — ED Notes (Signed)
Pt informed of wait time. 

## 2017-11-21 ENCOUNTER — Observation Stay (HOSPITAL_COMMUNITY): Payer: BC Managed Care – PPO

## 2017-11-21 ENCOUNTER — Encounter (HOSPITAL_COMMUNITY): Payer: Self-pay | Admitting: Radiology

## 2017-11-21 DIAGNOSIS — L02211 Cutaneous abscess of abdominal wall: Secondary | ICD-10-CM | POA: Diagnosis not present

## 2017-11-21 LAB — SURGICAL PCR SCREEN
MRSA, PCR: NEGATIVE
STAPHYLOCOCCUS AUREUS: NEGATIVE

## 2017-11-21 LAB — HIV ANTIBODY (ROUTINE TESTING W REFLEX): HIV Screen 4th Generation wRfx: NONREACTIVE

## 2017-11-21 LAB — APTT: aPTT: 32 seconds (ref 24–36)

## 2017-11-21 LAB — PROTIME-INR
INR: 1.1
Prothrombin Time: 14.1 seconds (ref 11.4–15.2)

## 2017-11-21 MED ORDER — FENTANYL CITRATE (PF) 100 MCG/2ML IJ SOLN
INTRAMUSCULAR | Status: AC
  Filled 2017-11-21: qty 2

## 2017-11-21 MED ORDER — MIDAZOLAM HCL 2 MG/2ML IJ SOLN
INTRAMUSCULAR | Status: AC
  Filled 2017-11-21: qty 4

## 2017-11-21 MED ORDER — FENTANYL CITRATE (PF) 100 MCG/2ML IJ SOLN
INTRAMUSCULAR | Status: AC | PRN
  Administered 2017-11-21 (×2): 50 ug via INTRAVENOUS

## 2017-11-21 MED ORDER — SODIUM CHLORIDE 0.9% FLUSH
5.0000 mL | Freq: Three times a day (TID) | INTRAVENOUS | Status: DC
Start: 2017-11-21 — End: 2017-11-22
  Administered 2017-11-21 – 2017-11-22 (×2): 5 mL via INTRAVENOUS

## 2017-11-21 MED ORDER — MIDAZOLAM HCL 2 MG/2ML IJ SOLN
INTRAMUSCULAR | Status: AC | PRN
  Administered 2017-11-21 (×3): 1 mg via INTRAVENOUS

## 2017-11-21 MED ORDER — LIDOCAINE HCL (PF) 1 % IJ SOLN
INTRAMUSCULAR | Status: AC | PRN
  Administered 2017-11-21 (×2): 10 mL

## 2017-11-21 NOTE — Progress Notes (Signed)
Patient ID: Sarah Watson, female   DOB: 03-11-1966, 52 y.o.   MRN: [id number]    Assessment:   Right psoas abscess with suprapubic communication:  I had a long discussion with the patient today about what I think has occurred.  It appears that the large stones that were previously located in the renal pelvis and UPJ region on the right-hand side have some how resulted in an abscess that has pointed down the psoas muscle and resulted in a large collection in the subcutaneous tissue in the suprapubic /lower abdominal region.  Interestingly the patient is minimally symptomatic with no fever, a normal white blood cell count and nothing to suggest systemic infection.  She now has stones that were previously located in right kidney in the fluid collection in the suprapubic region. I have discussed this case with the interventional radiologist as well as several of my partners and the consensus is that there appears to be a possible area of abscess fluid up near the kidney on the right-hand side that could potentially be drained percutaneously.  In addition the suprapubic fluid collection will be drained and this will be sent for culture.  Eventually she will need to have contrast injected through these drains to determine whether there is a persistent sinus and then will likely need to undergo exploration of the suprapubic region and removal of the stones that were located there.  Her right kidney is not obstructed at this time but has no function by renogram nor is there any contrast seen entering the parenchyma on the contrasted CT scan.  I do not believe that it is necessary that the kidney be removed although that remains to be determined.  Left renal calculi: She has left renal calculi.  The smaller stone is nonobstructing but the larger, 7 mm stone, appears to have obstructed an infundibulum and resulted in localized dilatation of the collecting system proximal to the stone with thinning of the parenchyma but  needs to be addressed in removed in my opinion as she essentially has only 1 kidney and I have discussed managing these stones ureteroscopically in the future.  Plan:  1.  Continue NPO.   2.  I will maintain her on oral Cipro for now.   3. Interventional Radiology will place drains in the suprapubic and perinephric psoas abscess sites.   Subjective: Patient is without complaint. Specifically she is not having any fever, flank pain, significant suprapubic discomfort or dysuria.  Objective: Vital signs in last 24 hours: Temp:  [98 F (36.7 C)-98.5 F (36.9 C)] 98 F (36.7 C) (01/09 0459) Pulse Rate:  [56-63] 62 (01/09 0459) Resp:  [14-17] 16 (01/09 0459) BP: (107-124)/(59-78) 109/59 (01/09 0459) SpO2:  [98 %-100 %] 100 % (01/09 0459) Weight:  [67.7 kg (149 lb 3.2 oz)-68.5 kg (151 lb)] 67.7 kg (149 lb 3.2 oz) (01/08 2020)  Intake/Output from previous day: 01/08 0701 - 01/09 0700 In: 340 [P.O.:240] Out: 300 [Urine:300] Intake/Output this shift: No intake/output data recorded.  Past Medical History:  Diagnosis Date  . Eczema   . History of ectopic pregnancy   . History of gestational diabetes   . Hydronephrosis, right   . Iron deficiency anemia    CHRONIC -- MONITORED BY DR [facility] ([facility])--  HX IV IRON INFUSION  . Nephrolithiasis    BILATERAL --  PER CT 02-21-2016 RIGHT RENAL CALCULI OBSTRUCTIVE /  LEFT RENAL CALCULI NONOBSTRUCTIVE  . Nocturia   . Right ureteral stone    Current  Facility-Administered Medications  Medication Dose Route Frequency Provider Last Rate Last Dose  . acetaminophen (TYLENOL) tablet 650 mg  650 mg Oral Q4H PRN Marton Redwood III, MD      . ciprofloxacin (CIPRO) tablet 500 mg  500 mg Oral BID Marton Redwood III, MD   500 mg at 11/21/17 3557  . diphenhydrAMINE (BENADRYL) injection 12.5 mg  12.5 mg Intravenous Q6H PRN Marton Redwood III, MD       Or  . diphenhydrAMINE (BENADRYL) 12.5 MG/5ML elixir 12.5 mg  12.5 mg Oral Q6H PRN Marton Redwood III, MD      . HYDROcodone-acetaminophen (NORCO/VICODIN) 5-325 MG per tablet 1-2 tablet  1-2 tablet Oral Q4H PRN Marton Redwood III, MD      . morphine 4 MG/ML injection 2-4 mg  2-4 mg Intravenous Q2H PRN Marton Redwood III, MD      . ondansetron (ZOFRAN) injection 4 mg  4 mg Intravenous Q4H PRN Marton Redwood III, MD      . oxybutynin (DITROPAN) tablet 5 mg  5 mg Oral Q8H PRN Marton Redwood III, MD      . senna-docusate (Senokot-S) tablet 1 tablet  1 tablet Oral QHS PRN Marton Redwood III, MD      . zolpidem (AMBIEN) tablet 5 mg  5 mg Oral QHS PRN Lucas Mallow, MD        Physical Exam:  General: Patient is in no apparent distress Lungs: Normal respiratory effort, chest expands symmetrically. GI: The abdomen is soft and nontender without mass. Back:  No CVAT.  Abdomen:  Slightly protuberant slightly to the right of midline in the lower abdomen.  This is mildly tender to palpation.    Lab Results: Recent Labs    11/20/17 1140  WBC 9.5  HGB 11.9*  HCT 38.3   BMET Recent Labs    11/19/17 1724 11/20/17 1140  NA  --  136  K  --  4.4  CL  --  102  CO2  --  26  GLUCOSE  --  75  BUN  --  16  CREATININE 1.20* 1.34*  CALCIUM  --  9.5   Recent Labs    11/21/17 0511  INR 1.10   No results for input(s): LABURIN in the last 72 hours. Results for orders placed or performed during the hospital encounter of 11/20/17  Surgical pcr screen     Status: None   Collection Time: 11/21/17 12:46 AM  Result Value Ref Range Status   MRSA, PCR NEGATIVE NEGATIVE Final   Staphylococcus aureus NEGATIVE NEGATIVE Final    Comment: (NOTE) The Xpert SA Assay (FDA approved for NASAL specimens in patients 53 years of age and older), is one component of a comprehensive surveillance program. It is not intended to diagnose infection nor to guide or monitor treatment.     Studies/Results: No results found.     Peregrine Nolt C 11/21/2017, 8:35 AM

## 2017-11-21 NOTE — Progress Notes (Signed)
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  She has had prior attempted retrograde stent placement which was unsuccessful in 2017.  She she fell approximately 4 months ago and now presents with abdominal distention/right abdominal mass and imaging findings 13 cm subcutaneous abscess with calcifications in the ventral pelvis that is primarily renal via a sinus tract spanning the right psoas.  The right kidney is contracted and poorly enhancing.  There is also left nephrolithiasis with progressive multiseptated cystic mass in the left interpolar region measuring approximately 3 cm and presumably infectious.  Request now received from urology for image guided drainage of the above-mentioned abscesses.  She currently denies fever, headache, chest pain, dyspnea, cough, back pain, nausea, vomiting or bleeding. Past Medical History:  Diagnosis Date  . Eczema   . History of ectopic pregnancy   . History of gestational diabetes   . Hydronephrosis, right   . Iron deficiency anemia    CHRONIC -- MONITORED BY [facility] ([facility])--  HX IV IRON INFUSION  . Nephrolithiasis    BILATERAL --  PER CT 02-21-2016 RIGHT RENAL CALCULI OBSTRUCTIVE /  LEFT RENAL CALCULI NONOBSTRUCTIVE  . Nocturia   . Right ureteral stone    Past Surgical History:  Procedure Laterality Date  . ACOUSTIC NEUROMA RESECTION Left 2008  . COLONOSCOPY N/A 08/03/2014   Procedure: COLONOSCOPY;  Surgeon: Danie Binder, MD;  Location: [facility];  Service: Endoscopy;  Laterality: N/A;  930  . CYSTOSCOPY W/ URETERAL STENT PLACEMENT Right 03/31/2016   Procedure: CYSTOSCOPY WITH RIGHT RETROGRADE PYELOGRAM,  URETEROSCOPY ;  Surgeon: Kathie Rhodes, MD;  Location: [facility];  Service: Urology;  Laterality: Right;  . ESOPHAGOGASTRODUODENOSCOPY N/A 08/03/2014   Procedure: ESOPHAGOGASTRODUODENOSCOPY (EGD);  Surgeon: Danie Binder, MD;  Location: [facility];  Service: Endoscopy;  Laterality: N/A;  . LAPAROSCOPY FOR ECTOPIC PREGNANCY  06-18-2006   RIGHT SALPINGECTOMY  . LAPAROSCOPY LEFT PARTIAL SALPINGECTOMY  06-29-2009   for desired sterilization     Allergies: Penicillins  Medications: Prior to Admission medications   Medication Sig Start Date End Date Taking? Authorizing Provider  HYDROcodone-acetaminophen (NORCO) 10-325 MG tablet Take 1-2 tablets by mouth every 4 (four) hours as needed for moderate pain. Maximum dose per 24 hours - 8 pills Patient not taking: Reported on 04/18/2016 03/31/16   Kathie Rhodes, MD  mirtazapine (REMERON) 30 MG tablet Take 1 tablet (30 mg total) by mouth at bedtime. Patient not taking: Reported on 04/18/2016 05/27/15   Curt Bears, MD     Vital Signs: BP (!) 109/59 (BP Location: Right Arm)   Pulse 62   Temp 98 F (36.7 C) (Oral)   Resp 16   Ht 5\' 4"  (1.626 m)   Wt 149 lb 3.2 oz (67.7 kg)   LMP 11/20/2013   SpO2 100%   BMI 25.61 kg/m   Physical Exam awake, alert.  Chest with slightly diminished breath sounds right base, left clear.  Heart with regular rate and rhythm.  Abdomen soft, palpable/tender right lower quadrant soft tissue fullness consistent with known abscess; no lower extremity edema Imaging: Ct Abdomen Pelvis W Contrast  Result Date: 11/19/2017 CLINICAL DATA:  Right upper abdominal swelling.  Fall 4 months ago. These results will be called to the ordering clinician or representative by the Radiology Department at the imaging location. EXAM: CT ABDOMEN AND PELVIS WITH CONTRAST TECHNIQUE: Multidetector CT imaging of the abdomen and pelvis was performed using the standard protocol following bolus administration of intravenous  contrast. CONTRAST:  146mL ISOVUE-300 IOPAMIDOL (ISOVUE-300) INJECTION 61% COMPARISON:  02/21/2016 FINDINGS: Lower chest:  Negative Hepatobiliary: No focal liver abnormality.No evidence of biliary obstruction or stone. Pancreas: Full appearance of the uncinate process of the pancreas without differential enhancement or ductal enlargement, stable and likely normal. Spleen: Unremarkable. Adrenals/Urinary Tract: Negative adrenals. The right kidney has significantly atrophied and is poorly enhancing. There is caliceal dilatation with a small renal pelvis. Perinephric stranding about the right kidney with fluid collection extending throughout the right psoas, along the right pelvic sidewall, and into the subcutaneous lower abdomen where there is a dominant collection measuring 13 x 7 cm. This abscess contains dependent bulky calcifications. Small calcifications at the parenchyma and hilum of the right kidney. The upper right ureter is thickened. No hydroureter. On the left there is a multi septated cystic structure with a stone at its base measuring 7 mm. The cystic area measures up to 3.8 cm. Stone disease has decreased on the left since prior, although the cystic mass is progressed. There is scarring in the left upper pole around a 5 mm calculus. Unremarkable bladder. Stomach/Bowel:  No obstruction. No inflammation. Vascular/Lymphatic: No acute vascular abnormality. No mass or adenopathy. Reproductive:No pathologic findings. Other: No ascites or pneumoperitoneum. Musculoskeletal: No acute or aggressive finding peer IMPRESSION: 1. 13 cm subcutaneous abscess with calcifications in the ventral pelvis that is primarily renal via a sinus tract spanning the right psoas. 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 Right caliectasis has improved and the right kidney has contracted and is poorly enhancing, correlating with 2017 renogram. 2. Left  nephrolithiasis with progressive multi septated cystic mass in the left interpolar kidney measuring ~3 cm, presumably also infectious. 3. Bilateral nephrolithiasis. Electronically Signed   By: Monte Fantasia M.D.   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  She has had prior attempted retrograde stent placement which was unsuccessful in 2017.  She underwent prior aspiration of a right superior pole calyx fluid collection in May 2017 with neg cx's.  She  fell approximately 4 months ago and now presents with abdominal distention/right abdominal mass and imaging findings of 13 cm subcutaneous abscess with calcifications in the ventral pelvis that is primarily renal via  a sinus tract spanning the right psoas.  The right kidney is contracted and poorly enhancing.  There is also left nephrolithiasis with progressive multiseptated cystic mass in the left interpolar region measuring approximately 3 cm and presumably infectious.  Request now received from urology for image guided drainage of the above-mentioned abscesses.  Imaging studies have been reviewed by Dr. Anselm Pancoast. Risks and benefits discussed with the patient including bleeding, infection, damage to adjacent structures, bowel perforation/fistula connection, possible need for surgery and sepsis.All of the patient's questions were answered, patient is  agreeable to proceed.Consent signed and in chart.  Current labs include WBC 9.5, hemoglobin 11.9, platelets 419k, creatinine 1.34, PT 14.1, INR 1.1.  She is afebrile.    Electronically Signed: D. Rowe Robert, PA-C 11/21/2017, 9:48 AM   I spent a total of 25 minutes at the the patient's bedside AND on the patient's hospital floor or unit, greater than 50% of which was counseling/coordinating care for CT-guided drainage of abdominal/pelvic abscesses    Patient ID: Sarah Watson, female   DOB: 1965-12-18, 52 y.o.   MRN: [id number]

## 2017-11-21 NOTE — Care Management Note (Signed)
Case Management Note  Patient Details  Name: Sarah Watson MRN: [id number] Date of Birth: 03/22/1966  Subjective/Objective:  52 y/o f admitted w/abd/pelvic abscess. From home. IR cons-drains placement.                   Action/Plan:d/c plan home.   Expected Discharge Date:                  Expected Discharge Plan:  Home/Self Care  In-House Referral:     Discharge planning Services  CM Consult  Post Acute Care Choice:    Choice offered to:     DME Arranged:    DME Agency:     HH Arranged:    HH Agency:     Status of Service:  In process, will continue to follow  If discussed at Long Length of Stay Meetings, dates discussed:    Additional Comments:  Dessa Phi, RN 11/21/2017, 10:44 AM

## 2017-11-21 NOTE — Procedures (Signed)
CT guided drain placement in anterior pelvic subcutaneous abscess.  Removed 150 ml of yellow purulent fluid.  12 french drain placed.  CT guided 10 Fr drain placed in right psoas muscle.  3 ml of bloody purulent fluid was removed.    No immediate complication.  Minimal blood loss.

## 2017-11-21 NOTE — Progress Notes (Signed)
Pt returned to floor. No complaints at this time

## 2017-11-22 DIAGNOSIS — L02211 Cutaneous abscess of abdominal wall: Secondary | ICD-10-CM | POA: Diagnosis not present

## 2017-11-22 LAB — CREATININE, FLUID (PLEURAL, PERITONEAL, JP DRAINAGE): Creat, Fluid: 0.6 mg/dL

## 2017-11-22 LAB — URINE CULTURE

## 2017-11-22 MED ORDER — CIPROFLOXACIN HCL 500 MG PO TABS: 500.0000 mg | ORAL_TABLET | Freq: Two times a day (BID) | ORAL | 0 refills | Status: DC

## 2017-11-22 MED ORDER — HYDROCODONE-ACETAMINOPHEN 10-325 MG PO TABS
1.0000 | ORAL_TABLET | ORAL | 0 refills | Status: DC | PRN
Start: 2017-11-22 — End: 2018-02-21

## 2017-11-22 NOTE — Discharge Summary (Signed)
Physician Discharge Summary      Patient ID: Sarah Watson MRN: [id number] DOB/AGE: 20-Aug-1966 52 y.o.  Admit date: 11/20/2017 Discharge date: 11/22/2017  Admission Diagnoses: Abscess [L02.91]  Discharge Diagnoses:  Active Problems:   Abscess   Discharged Condition: good  Hospital Course: She presented with lower abdominal swelling and some discomfort.  A CT scan revealed a nonfunctioning right kidney with what appeared to be a small so as abscess near the kidney with what appeared also to be some form of inflammatory tract obviously was taken by at least 2 large stones that were located at the UPJ and renal pelvic region about 7 months ago and now had somehow progressed down a tract that formed adjacent to the psoas muscle and performed a pocket beneath the skin in the lower abdomen.  This pocket of fluid contained stones. She had no systemic symptoms, was not febrile and had no elevation of her white blood cell count.  She underwent placement of a percutaneous drain in the abscess near the kidney on the right hand side and then a second drain in the pocket of fluid in the lower abdomen.  The abscess drain has had minimal output but the lower abdominal drain has had a moderate amount of output of what appears to be serosanguineous fluid.  This is going to be sent for creatinine prior to discharge and a culture was obtained from the abscess but remains pending.  She will therefore be discharged on empiric Cipro with plans to have her drains injected by interventional radiology next week in order to determine if there is a communication present.  She will then eventually need surgery to remove the stones in the pocket that has developed in her lower abdomen and then also ureteroscopic management of her left renal calculi on down the line.  She is doing well and feels much better and is therefore felt ready for discharge at this time.  Significant Diagnostic Studies: Ct Image Guided Drainage By  Percutaneous Catheter  Result Date: 11/21/2017 INDICATION: 52 year old with a complex urology history. 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 Stones have migrated from the right renal pelvic region to the anterior pelvic subcutaneous tissues and surrounded by a large fluid collection. The patient also has irregular fluid collections within the right psoas muscle that are concerning for abscess. Plan for CT-guided drainage of both areas. EXAM: CT GUIDED DRAINAGE OF ANTERIOR PELVIC SUBCUTANEOUS ABSCESS CT-GUIDED DRAINAGE OF RIGHT PSOAS ABSCESS MEDICATIONS: Sedation medications ANESTHESIA/SEDATION: 3.0 mg IV Versed 100 mcg IV Fentanyl Moderate Sedation Time:  56 minutes The patient was continuously monitored during the procedure by the interventional radiology nurse under my direct supervision. COMPLICATIONS: None immediate. TECHNIQUE: Informed written consent was obtained from the patient after a thorough discussion of the procedural risks, benefits and alternatives. All questions were addressed. A timeout was performed prior to the initiation of the procedure. PROCEDURE: Patient was placed supine on the CT scanner. Images through the pelvis were obtained. The large subcutaneous fluid collection was identified. The overlying skin was prepped with chlorhexidine and a sterile field was created. Skin was anesthetized with 1% lidocaine. 18 gauge needle was directed into the fluid collection with CT guidance. Yellow purulent fluid was aspirated. Stiff Amplatz wire was placed. Tract was dilated to accommodate a 12 [place] multipurpose drain. 150 mL of yellow purulent fluid was removed and attached to a suction bulb. Catheter was sutured to skin. Bandage was placed. Patient was then placed in a prone position. CT images through  the abdomen were obtained. The right psoas collection was identified. Right flank was prepped with chlorhexidine and sterile field was created. 18 gauge trocar needle was directed into the psoas  muscle with CT guidance. Small amount of purulent bloody fluid was aspirated. Stiff Amplatz wire was placed. Tract was dilated to accommodate a 10 [place] drain. Only 3 mL of bloody fluid could be removed from this collection. Catheter was sutured to skin and attached to a suction bulb. FINDINGS: Large fluid collection in the anterior pelvic subcutaneous tissues. Findings compatible with a subcutaneous abscess. 150 mL of purulent fluid was removed. Calcifications within this collection are compatible with the previously identified kidney stones. Small irregular collection in the right psoas muscle. Drain was successfully placed within this collection. Only 3 mL of bloody purulent fluid could be removed. IMPRESSION: CT-guided placement of a drainage catheter within the anterior pelvic subcutaneous abscess. Fluid was sent for culture. CT-guided placement of a drainage catheter within the right psoas abscess. Fluid was sent for culture. Electronically Signed   By: Markus Daft M.D.   On: 11/21/2017 15:49   Ct Image Guided Drainage By Percutaneous Catheter  Result Date: 11/21/2017 INDICATION: 52 year old with a complex urology history. 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 Stones have migrated from the right renal pelvic region to the anterior pelvic subcutaneous tissues and surrounded by a large fluid collection. The patient also has irregular fluid collections within the right psoas muscle that are concerning for abscess. Plan for CT-guided drainage of both areas. EXAM: CT GUIDED DRAINAGE OF ANTERIOR PELVIC SUBCUTANEOUS ABSCESS CT-GUIDED DRAINAGE OF RIGHT PSOAS ABSCESS MEDICATIONS: Sedation medications ANESTHESIA/SEDATION: 3.0 mg IV Versed 100 mcg IV Fentanyl Moderate Sedation Time:  56 minutes The patient was continuously monitored during the procedure by the interventional radiology nurse under my direct supervision. COMPLICATIONS: None immediate. TECHNIQUE: Informed written consent was obtained from the patient  after a thorough discussion of the procedural risks, benefits and alternatives. All questions were addressed. A timeout was performed prior to the initiation of the procedure. PROCEDURE: Patient was placed supine on the CT scanner. Images through the pelvis were obtained. The large subcutaneous fluid collection was identified. The overlying skin was prepped with chlorhexidine and a sterile field was created. Skin was anesthetized with 1% lidocaine. 18 gauge needle was directed into the fluid collection with CT guidance. Yellow purulent fluid was aspirated. Stiff Amplatz wire was placed. Tract was dilated to accommodate a 12 [place] multipurpose drain. 150 mL of yellow purulent fluid was removed and attached to a suction bulb. Catheter was sutured to skin. Bandage was placed. Patient was then placed in a prone position. CT images through the abdomen were obtained. The right psoas collection was identified. Right flank was prepped with chlorhexidine and sterile field was created. 18 gauge trocar needle was directed into the psoas muscle with CT guidance. Small amount of purulent bloody fluid was aspirated. Stiff Amplatz wire was placed. Tract was dilated to accommodate a 10 [place] drain. Only 3 mL of bloody fluid could be removed from this collection. Catheter was sutured to skin and attached to a suction bulb. FINDINGS: Large fluid collection in the anterior pelvic subcutaneous tissues. Findings compatible with a subcutaneous abscess. 150 mL of purulent fluid was removed. Calcifications within this collection are compatible with the previously identified kidney stones. Small irregular collection in the right psoas muscle. Drain was successfully placed within this collection. Only 3 mL of bloody purulent fluid could be removed. IMPRESSION: CT-guided placement of a drainage  catheter within the anterior pelvic subcutaneous abscess. Fluid was sent for culture. CT-guided placement of a drainage catheter within the right  psoas abscess. Fluid was sent for culture. Electronically Signed   By: Markus Daft M.D.   On: 11/21/2017 15:49    Discharge Exam: Blood pressure (!) 97/57, pulse (!) 54, temperature 97.8 F (36.6 C), temperature source Oral, resp. rate 15, height 5\' 4"  (1.626 m), weight 67.7 kg (149 lb 3.2 oz), last menstrual period 11/20/2013, SpO2 100 %. General: She is awake, alert and in no distress. Chest: Normal respiratory effort Cardiovascular: Regular rate and rhythm Abdomen: Soft, flat and with drain in right lower quadrant draining serosanguineous fluid. Back: No CVAT with drain in right upper back.  Disposition: 01-Home or Self Care  Discharge Instructions    Discharge patient   Complete by:  As directed    Discharge disposition:  01-Home or Self Care   Discharge patient date:  11/22/2017     Allergies as of 11/22/2017      Reactions   Penicillins Anaphylaxis   Childhood      Medication List    STOP taking these medications   mirtazapine 30 MG tablet Commonly known as:  REMERON     TAKE these medications   ciprofloxacin 500 MG tablet Commonly known as:  CIPRO Take 1 tablet (500 mg total) by mouth 2 (two) times daily.   HYDROcodone-acetaminophen 10-325 MG tablet Commonly known as:  NORCO Take 1-2 tablets by mouth every 4 (four) hours as needed for moderate pain. Maximum dose per 24 hours - 8 pills What changed:  Another medication with the same name was added. Make sure you understand how and when to take each.   HYDROcodone-acetaminophen 10-325 MG tablet Commonly known as:  NORCO Take 1-2 tablets by mouth every 4 (four) hours as needed for moderate pain. Maximum dose per 24 hours - 8 pills What changed:  You were already taking a medication with the same name, and this prescription was added. Make sure you understand how and when to take each.      Follow-up Information    Call Kathie Rhodes, MD.   Specialty:  Urology Why:  For an appointment in 1-2 weeks when you get  home. Contact information: [place] [address] [phone]           Signed: Claybon Jabs 11/22/2017, 7:24 AM

## 2017-11-22 NOTE — Progress Notes (Signed)
Patient is discharging home with both drainage tubes. Educated patient and sister on how to empty and recharge drains. Demonstrated understanding and all questions answered.

## 2017-11-22 NOTE — Care Management Note (Signed)
Case Management Note  Patient Details  Name: Sarah Watson MRN: [id number] Date of Birth: Jul 05, 1966  Subjective/Objective:                    Action/Plan:d/c home.   Expected Discharge Date:  11/22/17               Expected Discharge Plan:  Home/Self Care  In-House Referral:     Discharge planning Services  CM Consult  Post Acute Care Choice:    Choice offered to:     DME Arranged:    DME Agency:     HH Arranged:    HH Agency:     Status of Service:  Completed, signed off  If discussed at [employer] of Stay Meetings, dates discussed:    Additional Comments:  Dessa Phi, RN 11/22/2017, 9:41 AM

## 2017-11-26 LAB — AEROBIC/ANAEROBIC CULTURE W GRAM STAIN (SURGICAL/DEEP WOUND): Culture: NO GROWTH

## 2017-11-26 LAB — AEROBIC/ANAEROBIC CULTURE (SURGICAL/DEEP WOUND): CULTURE: NO GROWTH

## 2017-12-03 ENCOUNTER — Other Ambulatory Visit (HOSPITAL_COMMUNITY): Payer: Self-pay | Admitting: Urology

## 2017-12-03 DIAGNOSIS — L0291 Cutaneous abscess, unspecified: Secondary | ICD-10-CM

## 2017-12-05 ENCOUNTER — Encounter (HOSPITAL_COMMUNITY): Payer: Self-pay | Admitting: Diagnostic Radiology

## 2017-12-05 ENCOUNTER — Ambulatory Visit (HOSPITAL_COMMUNITY)
Admission: RE | Admit: 2017-12-05 | Discharge: 2017-12-05 | Disposition: A | Discharge status: Home / Self Care | Payer: BC Managed Care – PPO | Source: Ambulatory Visit | Attending: Urology | Admitting: Urology

## 2017-12-05 DIAGNOSIS — K6812 Psoas muscle abscess: Secondary | ICD-10-CM | POA: Diagnosis not present

## 2017-12-05 DIAGNOSIS — L0291 Cutaneous abscess, unspecified: Secondary | ICD-10-CM

## 2017-12-05 HISTORY — PX: IR SINUS/FIST TUBE CHK-NON GI: IMG673

## 2017-12-05 MED ORDER — IOPAMIDOL (ISOVUE-300) INJECTION 61%
INTRAVENOUS | Status: AC
  Administered 2017-12-05: 25 mL
  Filled 2017-12-05: qty 50

## 2017-12-05 MED ORDER — IOPAMIDOL (ISOVUE-300) INJECTION 61%
50.0000 mL | Freq: Once | INTRAVENOUS | Status: AC | PRN
  Administered 2017-12-05: 25 mL

## 2017-12-05 NOTE — Procedures (Signed)
Contrast injection of the right psoas abscess demonstrates a sinus tract between this collection and right inguinal collection.  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  Both drains were flushed and aspirated at end of procedure.  No blood loss and no immediate complication.

## 2017-12-07 ENCOUNTER — Other Ambulatory Visit: Payer: Self-pay | Admitting: Urology

## 2017-12-11 NOTE — Patient Instructions (Signed)
Sarah Watson  12/11/2017   Your procedure is scheduled on: 12-21-17  Report to [facility] Main  Entrance   Report to admitting at     0630AM    Call this number if you have problems the morning of surgery [phone]    Remember: Do not eat food or drink liquids :After Midnight.     Take these medicines the morning of surgery with A SIP OF WATER: NONE                                You may not have any metal on your body including hair pins and              piercings  Do not wear jewelry, make-up, lotions, powders or perfumes, deodorant             Do not wear nail polish.  Do not shave  48 hours prior to surgery.          Do not bring valuables to the hospital. [place].  Contacts, dentures or bridgework may not be worn into surgery.     Patients discharged the day of surgery will not be allowed to drive home.  Name and phone number of your driver:  Special Instructions: N/A              Please read over the following fact sheets you were given: _____________________________________________________________________           [facility] - Preparing for Surgery Before surgery, you can play an important role.  Because skin is not sterile, your skin needs to be as free of germs as possible.  You can reduce the number of germs on your skin by washing with CHG (chlorahexidine gluconate) soap before surgery.  CHG is an antiseptic cleaner which kills germs and bonds with the skin to continue killing germs even after washing. Please DO NOT use if you have an allergy to CHG or antibacterial soaps.  If your skin becomes reddened/irritated stop using the CHG and inform your nurse when you arrive at Short Stay. Do not shave (including legs and underarms) for at least 48 hours prior to the first CHG shower.  You may shave your face/neck. Please follow these instructions carefully:  1.  Shower with CHG Soap the night  before surgery and the  morning of Surgery.  2.  If you choose to wash your hair, wash your hair first as usual with your  normal  shampoo.  3.  After you shampoo, rinse your hair and body thoroughly to remove the  shampoo.                           4.  Use CHG as you would any other liquid soap.  You can apply chg directly  to the skin and wash                       Gently with a scrungie or clean washcloth.  5.  Apply the CHG Soap to your body ONLY FROM THE NECK DOWN.   Do not use on face/ open  Wound or open sores. Avoid contact with eyes, ears mouth and genitals (private parts).                       Wash face,  Genitals (private parts) with your normal soap.             6.  Wash thoroughly, paying special attention to the area where your surgery  will be performed.  7.  Thoroughly rinse your body with warm water from the neck down.  8.  DO NOT shower/wash with your normal soap after using and rinsing off  the CHG Soap.                9.  Pat yourself dry with a clean towel.            10.  Wear clean pajamas.            11.  Place clean sheets on your bed the night of your first shower and do not  sleep with pets. Day of Surgery : Do not apply any lotions/deodorants the morning of surgery.  Please wear clean clothes to the hospital/surgery center.  FAILURE TO FOLLOW THESE INSTRUCTIONS MAY RESULT IN THE CANCELLATION OF YOUR SURGERY PATIENT SIGNATURE_________________________________  NURSE SIGNATURE__________________________________  ________________________________________________________________________

## 2017-12-12 ENCOUNTER — Other Ambulatory Visit: Payer: Self-pay

## 2017-12-12 ENCOUNTER — Encounter (HOSPITAL_COMMUNITY)
Admission: RE | Admit: 2017-12-12 | Discharge: 2017-12-12 | Disposition: A | Discharge status: Home / Self Care | Payer: BC Managed Care – PPO | Source: Ambulatory Visit | Attending: Urology | Admitting: Urology

## 2017-12-12 ENCOUNTER — Encounter (HOSPITAL_COMMUNITY): Payer: Self-pay

## 2017-12-12 DIAGNOSIS — N200 Calculus of kidney: Secondary | ICD-10-CM | POA: Diagnosis not present

## 2017-12-12 DIAGNOSIS — Z01812 Encounter for preprocedural laboratory examination: Secondary | ICD-10-CM | POA: Diagnosis present

## 2017-12-12 HISTORY — DX: Major depressive disorder, single episode, unspecified: F32.9

## 2017-12-12 HISTORY — DX: Depression, unspecified: F32.A

## 2017-12-12 HISTORY — DX: Personal history of urinary calculi: Z87.442

## 2017-12-12 LAB — BASIC METABOLIC PANEL
ANION GAP: 8 (ref 5–15)
BUN: 17 mg/dL (ref 6–20)
CALCIUM: 9.4 mg/dL (ref 8.9–10.3)
CHLORIDE: 105 mmol/L (ref 101–111)
CO2: 26 mmol/L (ref 22–32)
Creatinine, Ser: 1.2 mg/dL — ABNORMAL HIGH (ref 0.44–1.00)
GFR calc Af Amer: 60 mL/min — ABNORMAL LOW (ref >60)
GFR calc non Af Amer: 51 mL/min — ABNORMAL LOW (ref >60)
Glucose, Bld: 85 mg/dL (ref 65–99)
POTASSIUM: 4.6 mmol/L (ref 3.5–5.1)
Sodium: 139 mmol/L (ref 135–145)

## 2017-12-12 LAB — CBC
HEMATOCRIT: 37.5 % (ref 36.0–46.0)
HEMOGLOBIN: 12 g/dL (ref 12.0–15.0)
MCH: 30.4 pg (ref 26.0–34.0)
MCHC: 32 g/dL (ref 30.0–36.0)
MCV: 94.9 fL (ref 78.0–100.0)
Platelets: 360 10*3/uL (ref 150–400)
RBC: 3.95 MIL/uL (ref 3.87–5.11)
RDW: 13.8 % (ref 11.5–15.5)
WBC: 8.2 10*3/uL (ref 4.0–10.5)

## 2017-12-20 NOTE — H&P (Signed)
HPI: Sarah Watson is a 52 year-old female who presents today for ureteroscopic management of her left renal calculi.  Her problem was discovered 02/21/2016. She had the following imaging studies done: CT Scan and Nuclear renal scan. She does not have a stent in place.   The ureteral obstruction is due to calculi. The ureteral obstruction has not been treated.   She is having flank pain. She does not have a history of urinary infections. Her renal function is normal. She did not see blood in her urine. The obstruction is on the right side.   05/29/16: She underwent a renogram on 05/22/16 which revealed no function on the right-hand side with normal function on the left and no evidence of obstruction on the left.   She is currently doing well. She has no flank pain and says overall she seems to be feeling much better. She has no urinary symptoms and also denies any hematuria.     CC/HPI: 12/05/17: She returns today for follow-up of a lower abdominal subcutaneous abscess that was drained percutaneously. This was associated with stones that had migrated from the UPJ region down her psoas muscle and into this location. The drainage was cultured and found to be negative and sent for creatinine which was found to be consistent with serum. A second drain was placed in a psoas abscess next to the kidney on the right-hand side. This also was cultured and found to be negative. She has been maintained on antibiotics.  She underwent a study today with contrast injected through her drain site. She told me that the drain from her flank is putting out only a few cc per 24 hr but the 1 in the lower abdomen was putting out a moderate amount and she was having to empty her bulb about 4 times a day but has decreased and she now has to empty it only 1 time a day. She remains afebrile. She is not having any significant flank pain other than from where the drain exits.     ALLERGIES: Penicillins    MEDICATIONS: [place]  Greaseless 15 %-10 % cream External  Ciprofloxacin Hcl 500 mg tablet Oral  Ferrous Sulfate 325 mg (65 mg iron) tablet Oral  Ibuprofen 200 mg tablet Oral  Multi-Vitamin With Minerals 7.5 mg iron-400 mcg tablet Oral  Vitamin D3 1,000 unit tablet Oral     GU PSH: Cystoscopy Ureteroscopy - 04/06/2016 Remove Fallopian Tube - 03/30/2016      PSH Notes: Cystoscopy With Ureteroscopy Right, Esophagogastroduodenoscopy, Complete Colonoscopy, Tubal Ligation, Salpingectomy, Excision Of Neuroma   NON-GU PSH: Diagnostic Colonoscopy - 03/30/2016 Tubal Ligation - 03/30/2016    GU PMH: Ureteral stricture (Stable), Right, In fact her right ureteral obstruction is secondary to an impacted stone. - 05/29/2016 Hydronephrosis Unspec, Hydronephrosis, right - 03/31/2016 Renal calculus, Renal calculus, bilateral - 03/31/2016 Oligomenorrhea, unspecified, Oligomenorrhea - 03/30/2016 Personal Hx Oth Urinary System diseases, History of renal failure - 03/30/2016      PMH Notes: Right ureteral obstruction: She was seen in the ER on 02/21/16 with vague moderate right upper and lower abdominal pain with anorexia. She said she began having pain in her back in 3/17. A CT scan revealed an obstructing right ureteral stone with marked thinning of the renal parenchyma and a perinephric fluid collection.  03/31/16 - Right retrograde pyelogram revealed no contrast would pass beyond the stone and I could not get a guidewire past the stone which was obviously impacted.  04/04/16 - Attempted right percutaneous nephrostomy  tube placement. The interventional radiologist could not identify any obvious collecting system but was able to obtain yellow fluid from an area that he suspected was an upper pole calyx. No drain was left and culture of the fluid proved negative.   Bilateral renal calculi: By CT scan 4/17 she was noted to have several stones on the left that appeared to possibly be obstructing the infundibului that they are associated with.  On the right hand side she has marked hydronephrosis with thinning of the parenchyma and a perinephric fluid collection displacing the kidney anteriorly with multiple loculations suggesting a possible forniceal rupture or perinephric hematoma. She has several very large stones located in the area of the right renal pelvis/UPJ with Hounsfield units of approximately 650.   NON-GU PMH: Encounter for general adult medical examination without abnormal findings, Encounter for preventive health examination - 03/31/2016 Minor contusion of unspecified kidney, initial encounter, Perinephric hematoma - 03/31/2016 Encounter for full-term uncomplicated delivery, NSVD (normal spontaneous vaginal delivery) - 03/30/2016 Personal history of diseases of the blood and blood-forming organs and certain disorders involving the immune mechanism, History of anemia - 03/30/2016 Personal history of diseases of the skin and subcutaneous tissue, History of eczema - 03/30/2016 Personal history of gestational diabetes, History of gestational diabetes mellitus (GDM) - 03/30/2016 Unspecified ectopic pregnancy without intrauterine pregnancy, Ectopic pregnancy - 03/30/2016    FAMILY HISTORY: Diabetes - Runs In Family malignant neoplasm of prostate - Runs In Family No pertinent family history - Other   SOCIAL HISTORY: Marital Status: Married Preferred Language: English; Ethnicity: Not Hispanic Or Latino; Race: White     Notes: Occupation, Married, Alcohol use, Number of children, Caffeine use, Never a smoker   REVIEW OF SYSTEMS:    GU Review Female:   Patient denies frequent urination, hard to postpone urination, burning /pain with urination, get up at night to urinate, leakage of urine, stream starts and stops, trouble starting your stream, have to strain to urinate, and being pregnant.  Gastrointestinal (Upper):   Patient denies nausea, vomiting, and indigestion/ heartburn.  Gastrointestinal (Lower):   Patient denies diarrhea and  constipation.  Constitutional:   Patient denies fatigue, night sweats, fever, and weight loss.  Skin:   Patient denies skin rash/ lesion and itching.  Eyes:   Patient denies blurred vision and double vision.  Ears/ Nose/ Throat:   Patient denies sore throat and sinus problems.  Hematologic/Lymphatic:   Patient denies swollen glands and easy bruising.  Cardiovascular:   Patient denies leg swelling and chest pains.  Respiratory:   Patient denies cough and shortness of breath.  Endocrine:   Patient denies excessive thirst.  Musculoskeletal:   Patient denies back pain and joint pain.  Neurological:   Patient denies headaches and dizziness.  Psychologic:   Patient denies depression and anxiety.   VITAL SIGNS:     Weight 147 lb / 66.68 kg  Height 63 in / 160.02 cm  BP 119/65 mmHg  Pulse 68 /min  BMI 26.0 kg/m   Physical Exam  Constitutional: Well nourished and well developed . No acute distress.    ENT:. The ears and nose are normal in appearance.    Neck: The appearance of the neck is normal and no neck mass is present.    Pulmonary: No respiratory distress and normal respiratory rhythm and effort.    Cardiovascular: Heart rate and rhythm are normal . No peripheral edema.    Abdomen: The abdomen is soft and nontender. No masses are palpated. No  CVA tenderness. No hernias are palpable. No hepatosplenomegaly noted.    Lymphatics: The femoral and inguinal nodes are not enlarged or tender.    Skin: Normal skin turgor, no visible rash and no visible skin lesions.    Neuro/Psych:. Mood and affect are appropriate.    PAST DATA REVIEWED:  Source Of History:  Patient  Lab Test Review:   BUN/Creatinine  Records Review:   Previous Hospital Records, Previous Patient Records, POC Tool  Urine Test Review:   Urine Culture  Notes:                     Her creatinine on 11/20/17 was 1.34.   PROCEDURES:        Notes:   I removed her flank drain today and applied a sterile dressing. I  have maintained her lower abdominal abscess drain in place.   ASSESSMENT/PLAN:   I reviewed her case with Dr. Alinda Money and we both agree that she has stones on the left-hand side that should be addressed in order to maintain as much renal function as possible and protect her single renal unit on that side. I therefore have recommended we proceed with ureteroscopy and laser lithotripsy with stent placement to address the stones in her left kidney. Once this has been undertaken she likely will need to undergo a right simple nephrectomy and exploration of the abscess cavity in her lower abdomen with removal of her stones from that location. The hope is that this can be performed laparoscopically.  2 GU:   Renal calculus - N20.0 Left, Stable - I have discussed with her proceeding with ureteroscopy and management of her left renal calculi with laser lithotripsy and stent placement in order to protect her single renal unit. I went over the procedure with her in detail including the risks and complications, the alternatives, the outpatient nature of the procedure as well as the probability of success and anticipated postoperative course. She understands the procedure and the rationale behind treating this side 1st.  1 NON-GU:   Cutaneous abscess of other sites - L02.818 Stable - Her psoas abscess appears to communicate with the abscess in her suprapubic region.

## 2017-12-21 ENCOUNTER — Ambulatory Visit (HOSPITAL_COMMUNITY): Payer: BC Managed Care – PPO

## 2017-12-21 ENCOUNTER — Ambulatory Visit (HOSPITAL_COMMUNITY): Payer: BC Managed Care – PPO | Admitting: Anesthesiology

## 2017-12-21 ENCOUNTER — Encounter (HOSPITAL_COMMUNITY)
Admission: RE | Disposition: A | Discharge status: Home / Self Care | Payer: Self-pay | Source: Ambulatory Visit | Attending: Urology

## 2017-12-21 ENCOUNTER — Encounter (HOSPITAL_COMMUNITY): Payer: Self-pay | Admitting: *Deleted

## 2017-12-21 ENCOUNTER — Ambulatory Visit (HOSPITAL_COMMUNITY)
Admission: RE | Admit: 2017-12-21 | Discharge: 2017-12-21 | Disposition: A | Discharge status: Home / Self Care | Payer: BC Managed Care – PPO | Source: Ambulatory Visit | Attending: Urology | Admitting: Urology

## 2017-12-21 DIAGNOSIS — Z880 Allergy status to penicillin: Secondary | ICD-10-CM | POA: Diagnosis not present

## 2017-12-21 DIAGNOSIS — N200 Calculus of kidney: Secondary | ICD-10-CM | POA: Diagnosis not present

## 2017-12-21 DIAGNOSIS — K6812 Psoas muscle abscess: Secondary | ICD-10-CM | POA: Insufficient documentation

## 2017-12-21 DIAGNOSIS — F329 Major depressive disorder, single episode, unspecified: Secondary | ICD-10-CM | POA: Insufficient documentation

## 2017-12-21 DIAGNOSIS — L02211 Cutaneous abscess of abdominal wall: Secondary | ICD-10-CM | POA: Diagnosis not present

## 2017-12-21 HISTORY — PX: URETEROSCOPY WITH HOLMIUM LASER LITHOTRIPSY: SHX6645

## 2017-12-21 SURGERY — URETEROSCOPY, WITH LITHOTRIPSY USING HOLMIUM LASER
Anesthesia: General | Laterality: Left

## 2017-12-21 MED ORDER — PHENAZOPYRIDINE HCL 200 MG PO TABS: 200.0000 mg | ORAL_TABLET | Freq: Three times a day (TID) | ORAL | Status: DC

## 2017-12-21 MED ORDER — MIDAZOLAM HCL 2 MG/2ML IJ SOLN
INTRAMUSCULAR | Status: AC
  Filled 2017-12-21: qty 2

## 2017-12-21 MED ORDER — PROPOFOL 10 MG/ML IV BOLUS
INTRAVENOUS | Status: AC
  Filled 2017-12-21: qty 20

## 2017-12-21 MED ORDER — OXYCODONE HCL 5 MG PO TABS
5.0000 mg | ORAL_TABLET | Freq: Once | ORAL | Status: AC | PRN
  Administered 2017-12-21: 5 mg via ORAL

## 2017-12-21 MED ORDER — MIDAZOLAM HCL 5 MG/5ML IJ SOLN
INTRAMUSCULAR | Status: DC | PRN
  Administered 2017-12-21: 2 mg via INTRAVENOUS

## 2017-12-21 MED ORDER — FENTANYL CITRATE (PF) 100 MCG/2ML IJ SOLN
INTRAMUSCULAR | Status: AC
  Filled 2017-12-21: qty 2

## 2017-12-21 MED ORDER — 0.9 % SODIUM CHLORIDE (POUR BTL) OPTIME
TOPICAL | Status: DC | PRN
  Administered 2017-12-21: 1000 mL

## 2017-12-21 MED ORDER — IOHEXOL 300 MG/ML  SOLN
INTRAMUSCULAR | Status: DC | PRN
  Administered 2017-12-21: 34 mL

## 2017-12-21 MED ORDER — ONDANSETRON HCL 4 MG/2ML IJ SOLN
INTRAMUSCULAR | Status: AC
  Filled 2017-12-21: qty 2

## 2017-12-21 MED ORDER — OXYCODONE HCL 5 MG/5ML PO SOLN
5.0000 mg | Freq: Once | ORAL | Status: AC | PRN
  Filled 2017-12-21: qty 5

## 2017-12-21 MED ORDER — CIPROFLOXACIN IN D5W 400 MG/200ML IV SOLN
400.0000 mg | Freq: Once | INTRAVENOUS | Status: AC
  Administered 2017-12-21: 400 mg via INTRAVENOUS
  Filled 2017-12-21: qty 200

## 2017-12-21 MED ORDER — OXYCODONE HCL 5 MG PO TABS
5.0000 mg | ORAL_TABLET | Freq: Once | ORAL | Status: AC
  Administered 2017-12-21: 5 mg via ORAL

## 2017-12-21 MED ORDER — LACTATED RINGERS IV SOLN
INTRAVENOUS | Status: DC
  Administered 2017-12-21: 07:00:00 via INTRAVENOUS
  Administered 2017-12-21: 1000 mL via INTRAVENOUS

## 2017-12-21 MED ORDER — PROMETHAZINE HCL 25 MG/ML IJ SOLN
INTRAMUSCULAR | Status: AC
  Filled 2017-12-21: qty 1

## 2017-12-21 MED ORDER — PHENAZOPYRIDINE HCL 200 MG PO TABS
ORAL_TABLET | ORAL | Status: AC
  Administered 2017-12-21: 11:00:00
  Filled 2017-12-21: qty 1

## 2017-12-21 MED ORDER — PROPOFOL 10 MG/ML IV BOLUS
INTRAVENOUS | Status: DC | PRN
  Administered 2017-12-21: 150 mg via INTRAVENOUS

## 2017-12-21 MED ORDER — SODIUM CHLORIDE 0.9 % IR SOLN
Status: DC | PRN
  Administered 2017-12-21: 3000 mL

## 2017-12-21 MED ORDER — DEXAMETHASONE SODIUM PHOSPHATE 10 MG/ML IJ SOLN
INTRAMUSCULAR | Status: AC
  Filled 2017-12-21: qty 1

## 2017-12-21 MED ORDER — OXYCODONE HCL 5 MG PO TABS
ORAL_TABLET | ORAL | Status: AC
  Filled 2017-12-21: qty 1

## 2017-12-21 MED ORDER — DEXAMETHASONE SODIUM PHOSPHATE 10 MG/ML IJ SOLN
INTRAMUSCULAR | Status: DC | PRN
  Administered 2017-12-21: 8 mg via INTRAVENOUS

## 2017-12-21 MED ORDER — FENTANYL CITRATE (PF) 100 MCG/2ML IJ SOLN
INTRAMUSCULAR | Status: DC | PRN
  Administered 2017-12-21: 25 ug via INTRAVENOUS
  Administered 2017-12-21: 50 ug via INTRAVENOUS
  Administered 2017-12-21: 25 ug via INTRAVENOUS

## 2017-12-21 MED ORDER — FENTANYL CITRATE (PF) 100 MCG/2ML IJ SOLN
25.0000 ug | INTRAMUSCULAR | Status: DC | PRN
  Administered 2017-12-21: 50 ug via INTRAVENOUS

## 2017-12-21 MED ORDER — ONDANSETRON HCL 4 MG/2ML IJ SOLN
INTRAMUSCULAR | Status: DC | PRN
  Administered 2017-12-21: 4 mg via INTRAVENOUS

## 2017-12-21 MED ORDER — LIDOCAINE 2% (20 MG/ML) 5 ML SYRINGE
INTRAMUSCULAR | Status: AC
  Filled 2017-12-21: qty 5

## 2017-12-21 MED ORDER — LIDOCAINE 2% (20 MG/ML) 5 ML SYRINGE
INTRAMUSCULAR | Status: DC | PRN
  Administered 2017-12-21: 50 mg via INTRAVENOUS

## 2017-12-21 MED ORDER — PROMETHAZINE HCL 25 MG/ML IJ SOLN
6.2500 mg | INTRAMUSCULAR | Status: DC | PRN
  Administered 2017-12-21: 6.25 mg via INTRAVENOUS

## 2017-12-21 MED ORDER — PHENAZOPYRIDINE HCL 200 MG PO TABS: 200.0000 mg | ORAL_TABLET | Freq: Three times a day (TID) | ORAL | 0 refills | Status: DC | PRN

## 2017-12-21 SURGICAL SUPPLY — 20 items
BAG URO CATCHER STRL LF (MISCELLANEOUS) ×2 IMPLANT
BASKET DAKOTA 1.9FR 11X120 (BASKET) IMPLANT
BASKET ZERO TIP 1.9FR (BASKET) ×2 IMPLANT
BASKET ZERO TIP NITINOL 2.4FR (BASKET) IMPLANT
CATH INTERMIT  6FR 70CM (CATHETERS) ×2 IMPLANT
CLOTH BEACON ORANGE TIMEOUT ST (SAFETY) ×2 IMPLANT
COVER FOOTSWITCH UNIV (MISCELLANEOUS) IMPLANT
FIBER LASER TRAC TIP (UROLOGICAL SUPPLIES) IMPLANT
GLOVE BIOGEL M 8.0 STRL (GLOVE) ×2 IMPLANT
GOWN STRL REUS W/TWL XL LVL3 (GOWN DISPOSABLE) ×2 IMPLANT
GUIDEWIRE ANG ZIPWIRE 038X150 (WIRE) IMPLANT
GUIDEWIRE STR DUAL SENSOR (WIRE) ×6 IMPLANT
MANIFOLD NEPTUNE II (INSTRUMENTS) ×2 IMPLANT
PACK CYSTO (CUSTOM PROCEDURE TRAY) ×2 IMPLANT
SHEATH ACCESS URETERAL 24CM (SHEATH) ×2 IMPLANT
SHEATH ACCESS URETERAL 54CM (SHEATH) IMPLANT
SHEATH URETERAL 12FRX35CM (MISCELLANEOUS) ×2 IMPLANT
STENT POLARIS 5FRX24 (STENTS) ×2 IMPLANT
TUBING CONNECTING 10 (TUBING) ×2 IMPLANT
TUBING UROLOGY SET (TUBING) ×2 IMPLANT

## 2017-12-21 NOTE — Anesthesia Postprocedure Evaluation (Signed)
Anesthesia Post Note  Patient: Khamil Lamica  Procedure(s) Performed: CYSTOSCOPY, RETROGRADE, URETEROSCOPY WITH STENT PLACEMENT (Left )     Patient location during evaluation: PACU Anesthesia Type: General Level of consciousness: awake and alert Pain management: pain level controlled Vital Signs Assessment: post-procedure vital signs reviewed and stable Respiratory status: spontaneous breathing, nonlabored ventilation and respiratory function stable Cardiovascular status: blood pressure returned to baseline and stable Postop Assessment: no apparent nausea or vomiting Anesthetic complications: no    Last Vitals:  Vitals:   12/21/17 1045 12/21/17 1100  BP: 128/78 131/83  Pulse: (!) 45 (!) 46  Resp: 10 15  Temp:  36.4 C  SpO2: 99% 98%                   Audry Pili

## 2017-12-21 NOTE — Discharge Instructions (Signed)
Post stent placement surgery instructions   Definitions:  Ureter: The duct that transports urine from the kidney to the bladder. Stent: A plastic hollow tube that is placed into the ureter, from the kidney to the bladder to prevent the ureter from swelling shut.  General instructions:  Despite the fact that no skin incisions were used, the area around the ureter and bladder is raw and irritated. The stent is a foreign body which will further irritate the bladder wall. This irritation is manifested by increased frequency of urination, both day and night, and by an increase in the urge to urinate. In some, the urge to urinate is present almost always. Sometimes the urge is strong enough that you may not be able to stop your self from urinating. The only real cure is to remove the stent and then give time for the bladder wall to heal which can't be done until the danger of the ureter swelling shut has passed. (This varies from 2-21 days).  You may see some blood in your urine while the stent is in place and a few days afterward. Do not be alarmed, even if the urine is clear for a while. Get off your feet and drink lots of fluids until clearing occurs. If you start to pass clots or don't improve, call us.  If you have a string coming from your urethra:  The stent string is attached to your ureteral stent.  Do not pull on thisIf you have a string coming from your urethra:  The stent string is attached to your ureteral stent.  Do not pull on this.  Diet:  You may return to your normal diet immediately. Because of the raw surface of your bladder, alcohol, spicy foods, foods high in acid and drinks with caffeine may cause irritation or frequency and should be used in moderation. To keep your urine flowing freely and avoid constipation, drink plenty of fluids during the day (8-10 glasses). Tip: Avoid cranberry juice because it is very acidic.  Activity:  Your physical activity doesn't need to be  restricted. However, if you are very active, you may see some blood in the urine. We suggest that you reduce your activity under the circumstances until the bleeding has stopped.  Bowels:  It is important to keep your bowels regular during the postoperative period. Straining with bowel movements can cause bleeding. A bowel movement every other day is reasonable. Use a mild laxative if needed, such as milk of magnesia 2-3 tablespoons, or 2 Dulcolax tablets. Call if you continue to have problems. If you had been taking narcotics for pain, before, during or after your surgery, you may be constipated. Take a laxative if necessary.     Medication:  You should resume your pre-surgery medications unless told not to. DO NOT RESUME YOUR ASPIRIN, or any other medicines like ibuprofen, motrin, excedrin, advil, aleve, vitamin E, fish oil as these can all cause bleeding x 7 days. In addition you may be given an antibiotic to prevent or treat infection. Antibiotics are not always necessary. All medication should be taken as prescribed until the bottles are finished unless you are having an unusual reaction to one of the drugs.  Problems you should report to [place]:  a. Fever greater than 101F. b. Heavy bleeding, or clots (see notes above about blood in urine). c. Inability to urinate. d. Drug reactions (hives, rash, nausea, vomiting, diarrhea). e. Severe burning or pain with urination that is not improving.  Followup:  You  will need a followup appointment to monitor your progress in most cases. Please call the office for this appointment when you get home if your appointment has not already been scheduled. Usually the first appointment will be about 5-14 days after your surgery and if you have a stent in place it will likely be removed at that time.

## 2017-12-21 NOTE — Anesthesia Preprocedure Evaluation (Addendum)
Anesthesia Evaluation  Patient identified by MRN, date of birth, ID band Patient awake    Reviewed: Allergy & Precautions, NPO status , Patient's Chart, lab work & pertinent test results  History of Anesthesia Complications Negative for: history of anesthetic complications  Airway Mallampati: II  TM Distance: >3 FB Neck ROM: Full    Dental  (+) Teeth Intact, Dental Advisory Given   Pulmonary neg pulmonary ROS,    breath sounds clear to auscultation       Cardiovascular negative cardio ROS   Rhythm:Regular Rate:Normal     Neuro/Psych Depression negative neurological ROS     GI/Hepatic negative GI ROS, Neg liver ROS,   Endo/Other  Hx gestational DM  Renal/GU Nephrolithiasis     Musculoskeletal negative musculoskeletal ROS (+)   Abdominal   Peds  Hematology negative hematology ROS (+)   Anesthesia Other Findings   Reproductive/Obstetrics                             Anesthesia Physical  Anesthesia Plan  ASA: II  Anesthesia Plan: General   Post-op Pain Management:    Induction: Intravenous  PONV Risk Score and Plan: 3 and Ondansetron, Dexamethasone, Midazolam and Treatment may vary due to age or medical condition  Airway Management Planned: LMA  Additional Equipment: None  Intra-op Plan:   Post-operative Plan: Extubation in OR  Informed Consent: I have reviewed the patients History and Physical, chart, labs and discussed the procedure including the risks, benefits and alternatives for the proposed anesthesia with the patient or authorized representative who has indicated his/her understanding and acceptance.   Dental advisory given  Plan Discussed with: CRNA  Anesthesia Plan Comments:         Anesthesia Quick Evaluation

## 2017-12-21 NOTE — Anesthesia Procedure Notes (Signed)
Procedure Name: LMA Insertion Date/Time: 12/21/2017 8:34 AM Performed by: Anne Fu, CRNA Pre-anesthesia Checklist: Patient identified, Emergency Drugs available, Suction available, Patient being monitored and Timeout performed Patient Re-evaluated:Patient Re-evaluated prior to induction Oxygen Delivery Method: Circle system utilized Preoxygenation: Pre-oxygenation with 100% oxygen Induction Type: IV induction Ventilation: Mask ventilation without difficulty LMA: LMA inserted LMA Size: 4.0 Number of attempts: 1 Placement Confirmation: positive ETCO2 and breath sounds checked- equal and bilateral Tube secured with: Tape

## 2017-12-21 NOTE — Op Note (Signed)
PATIENT:  Sarah Watson  PRE-OPERATIVE DIAGNOSIS: Left renal calculi (upper and middle pole)  POST-OPERATIVE DIAGNOSIS: Same  PROCEDURE: 1.  Cystoscopy with left retrograde and pyelogram including interpretation. 2.  Left ureteroscopy and additional pyelography. 3.  Left double-J stent placement.  SURGEON:  Claybon Jabs  INDICATION: Sarah Watson is a 52 year old female who has an extremely unusual condition that consists of a stone that had migrated outside of the collecting system and down a fistulous tract on top of the so as on the right-hand side ending up in a abscess cavity in the suprapubic subcutaneous tissue.  This was drained.  Further studies revealed an abscess tract from the kidney all the way down to this suprapubic region and all cultures have been negative.  A renogram revealed no function of her right kidney and it is felt that she will likely need to undergo right nephrectomy and removal of the stone that is now located in the suprapubic subcutaneous tissue. She had 2 stones in her left kidney which appear to be normal and she had normal renal function so it was felt these stones should be addressed.  The stone in the upper pole appeared to have no evidence of obstruction but the middle pole stone appeared to be associated with some fluid collection behind it indicating there was some form of obstruction caused by this stone and my goal was to address both of these stones ureteroscopically in order to free her remaining kidney of any potential stones that could cause occlusion of her ureter in the future.  ANESTHESIA:  General  EBL:  Minimal  DRAINS: 5 French, 24 cm Polaris stent (string placed in vagina)  LOCAL MEDICATIONS USED:  None  SPECIMEN: None  Description of procedure: After informed consent the patient was taken to the operating room and placed on the table in a supine position. General anesthesia was then administered. Once fully anesthetized the patient was moved  to the dorsal lithotomy position and the genitalia were sterilely prepped and draped in standard fashion. An official timeout was then performed.  Initially the 23 French cystoscope was passed under direct vision in the bladder.  The bladder was fully and systematically inspected and noted to be free of any tumors, stones or inflammatory lesions.  The right and left ureteral orifice were noted to be normal configuration and position.  A 6 French open-ended ureteral catheter was then passed through the cystoscope and into the left ureter.  Full-strength Omnipaque contrast was then injected through the open-ended stent to perform a left retrograde pyelogram in the standard fashion.  Real-time fluoroscopy was performed as contrast was injected up the ureter which was noted to be entirely normal.  The intrarenal collecting system filled out and I could see a filling defect in an upper pole calyx in the medial portion of that upper pole calyx.  This area did fill with contrast but the location of the stone in the middle pole could be seen fluoroscopically but no contrast was seen following an infundibulum into that area or contrast beyond the stone either.  I then passed a 0.038 inch sensor guidewire through the open-ended catheter into the area the renal pelvis and left this in place and removed the open-ended catheter and cystoscope.  The ureteral access sheath was then passed over the guidewire and easily up the left ureter with the inner portion of the access sheath and guidewire being removed.  I then passed the 6 [place] digital ureteroscope through the access  sheath and into the area the renal pelvis.  It was inspected and noted to be free of any tumors or lesions.  I first advanced the scope into the upper pole and spent a great deal of time attempting to locate the stone but was unsuccessful.  I therefore injected contrast through the ureteroscope and could see where the stone was located.  I tried to  advance the scope to that location under visualization directly and fluoroscopically but was on able to ever identify the stone.  It appears the stone may be in a peripheral upper pole calyx and my thought is that there is little probability of the at spontaneous passage due to the fact that I was unable to access this with the ureteroscope.  I then directed my attention to the middle pole calyceal system.  I was able to advance the scope into a calyx in the middle pole but there was no stone located there.  There was an area just outside of the infundibulum that appeared to have some mild inflammation.  I first tried to pass the guidewire through the ureteroscope and into this area under direct vision and with the use of fluoroscopy but was unable to gain access to the location of the stone which still could be seen fluoroscopically.  I went above the middle pole calyx to see if there might be some location there where there was access but noted none.  I then again injected contrast and none could be seen going into an area where the stone in the middle pole is located.  I made several more attempts at passing the guidewire both under direct vision and fluoroscopically but was unsuccessful.  At no time did I ever see the middle pole or upper pole stone for that matter.  It would appear that the middle pole stone is likely within a calyx that has become stenosed or scarred or even possibly inflamed to the point where no access to the stone was possible.  I replaced the guidewire through the ureteroscope and left this in place removing the ureteroscope and access sheath and then backloaded the cystoscope.  I then passed the double-J stent over the guidewire into the area of the renal pelvis and as I remove the guidewire good curl was noted in the renal pelvis.  The string, which was affixed to the distal aspect of the stent, was shortened and placed in the vagina and the patient was awakened and taken to the  recovery room in stable and satisfactory condition.  She tolerated the procedure well with no intraoperative complications.  PLAN OF CARE: Discharge to home after PACU  PATIENT DISPOSITION:  PACU - hemodynamically stable.

## 2017-12-21 NOTE — Transfer of Care (Signed)
Immediate Anesthesia Transfer of Care Note  Patient: Sarah Watson  Procedure(s) Performed: Procedure(s): CYSTOSCOPY, RETROGRADE, URETEROSCOPY WITH STENT PLACEMENT (Left)  Patient Location: PACU  Anesthesia Type:General  Level of Consciousness:  sedated, patient cooperative and responds to stimulation  Airway & Oxygen Therapy:Patient Spontanous Breathing and Patient connected to face mask oxgen  Post-op Assessment:  Report given to PACU RN and Post -op Vital signs reviewed and stable  Post vital signs:  Reviewed and stable  Last Vitals:  Vitals:   12/21/17 0635  BP: 131/72  Pulse: (!) 58  Resp: 16  Temp: 36.8 C  SpO2: 881%    Complications: No apparent anesthesia complications

## 2017-12-22 ENCOUNTER — Encounter (HOSPITAL_COMMUNITY): Payer: Self-pay | Admitting: Urology

## 2017-12-24 ENCOUNTER — Encounter (HOSPITAL_COMMUNITY): Payer: Self-pay | Admitting: Urology

## 2018-01-28 ENCOUNTER — Other Ambulatory Visit: Payer: Self-pay | Admitting: Urology

## 2018-02-13 NOTE — Patient Instructions (Signed)
Sarah Watson  02/13/2018   Your procedure is scheduled on: 02-18-18   Report to [facility] Main  Entrance Report to Admitting at 9:15 AM .  Call this number if you have problems the morning of surgery [phone]   Remember: Do not eat food or drink liquids :After Midnight.     Take these medicines the morning of surgery with A SIP OF WATER: None                                You may not have any metal on your body including hair pins and              piercings  Do not wear jewelry, make-up, lotions, powders or perfumes, deodorant             Do not wear nail polish.  Do not shave  48 hours prior to surgery.                 Do not bring valuables to the hospital. [place].  Contacts, dentures or bridgework may not be worn into surgery.  Leave suitcase in the car. After surgery it may be brought to your room.                  Please read over the following fact sheets you were given: _____________________________________________________________________           [facility] - Preparing for Surgery Before surgery, you can play an important role.  Because skin is not sterile, your skin needs to be as free of germs as possible.  You can reduce the number of germs on your skin by washing with CHG (chlorahexidine gluconate) soap before surgery.  CHG is an antiseptic cleaner which kills germs and bonds with the skin to continue killing germs even after washing. Please DO NOT use if you have an allergy to CHG or antibacterial soaps.  If your skin becomes reddened/irritated stop using the CHG and inform your nurse when you arrive at Short Stay. Do not shave (including legs and underarms) for at least 48 hours prior to the first CHG shower.  You may shave your face/neck. Please follow these instructions carefully:  1.  Shower with CHG Soap the night before surgery and the  morning of Surgery.  2.  If you choose to wash  your hair, wash your hair first as usual with your  normal  shampoo.  3.  After you shampoo, rinse your hair and body thoroughly to remove the  shampoo.                           4.  Use CHG as you would any other liquid soap.  You can apply chg directly  to the skin and wash                       Gently with a scrungie or clean washcloth.  5.  Apply the CHG Soap to your body ONLY FROM THE NECK DOWN.   Do not use on face/ open  Wound or open sores. Avoid contact with eyes, ears mouth and genitals (private parts).                       Wash face,  Genitals (private parts) with your normal soap.             6.  Wash thoroughly, paying special attention to the area where your surgery  will be performed.  7.  Thoroughly rinse your body with warm water from the neck down.  8.  DO NOT shower/wash with your normal soap after using and rinsing off  the CHG Soap.                9.  Pat yourself dry with a clean towel.            10.  Wear clean pajamas.            11.  Place clean sheets on your bed the night of your first shower and do not  sleep with pets. Day of Surgery : Do not apply any lotions/deodorants the morning of surgery.  Please wear clean clothes to the hospital/surgery center.  FAILURE TO FOLLOW THESE INSTRUCTIONS MAY RESULT IN THE CANCELLATION OF YOUR SURGERY PATIENT SIGNATURE_________________________________  NURSE SIGNATURE__________________________________  ________________________________________________________________________  WHAT IS A BLOOD TRANSFUSION? Blood Transfusion Information  A transfusion is the replacement of blood or some of its parts. Blood is made up of multiple cells which provide different functions.  Red blood cells carry oxygen and are used for blood loss replacement.  White blood cells fight against infection.  Platelets control bleeding.  Plasma helps clot blood.  Other blood products are available for specialized needs,  such as hemophilia or other clotting disorders. BEFORE THE TRANSFUSION  Who gives blood for transfusions?   Healthy volunteers who are fully evaluated to make sure their blood is safe. This is blood bank blood. Transfusion therapy is the safest it has ever been in the practice of medicine. Before blood is taken from a donor, a complete history is taken to make sure that person has no history of diseases nor engages in risky social behavior (examples are intravenous drug use or sexual activity with multiple partners). The donor's travel history is screened to minimize risk of transmitting infections, such as malaria. The donated blood is tested for signs of infectious diseases, such as HIV and hepatitis. The blood is then tested to be sure it is compatible with you in order to minimize the chance of a transfusion reaction. If you or a relative donates blood, this is often done in anticipation of surgery and is not appropriate for emergency situations. It takes many days to process the donated blood. RISKS AND COMPLICATIONS Although transfusion therapy is very safe and saves many lives, the main dangers of transfusion include:   Getting an infectious disease.  Developing a transfusion reaction. This is an allergic reaction to something in the blood you were given. Every precaution is taken to prevent this. The decision to have a blood transfusion has been considered carefully by your caregiver before blood is given. Blood is not given unless the benefits outweigh the risks. AFTER THE TRANSFUSION  Right after receiving a blood transfusion, you will usually feel much better and more energetic. This is especially true if your red blood cells have gotten low (anemic). The transfusion raises the level of the red blood cells which carry oxygen, and this usually causes an energy increase.  The  nurse administering the transfusion will monitor you carefully for complications. HOME CARE INSTRUCTIONS  No  special instructions are needed after a transfusion. You may find your energy is better. Speak with your caregiver about any limitations on activity for underlying diseases you may have. SEEK MEDICAL CARE IF:   Your condition is not improving after your transfusion.  You develop redness or irritation at the intravenous (IV) site. SEEK IMMEDIATE MEDICAL CARE IF:  Any of the following symptoms occur over the next 12 hours:  Shaking chills.  You have a temperature by mouth above 102 F (38.9 C), not controlled by medicine.  Chest, back, or muscle pain.  People around you feel you are not acting correctly or are confused.  Shortness of breath or difficulty breathing.  Dizziness and fainting.  You get a rash or develop hives.  You have a decrease in urine output.  Your urine turns a dark color or changes to pink, red, or Meikle. Any of the following symptoms occur over the next 10 days:  You have a temperature by mouth above 102 F (38.9 C), not controlled by medicine.  Shortness of breath.  Weakness after normal activity.  The white part of the eye turns yellow (jaundice).  You have a decrease in the amount of urine or are urinating less often.  Your urine turns a dark color or changes to pink, red, or Vine. Document Released: 10/27/2000 Document Revised: 01/22/2012 Document Reviewed: 06/15/2008 [facility] Patient Information 2014 [place], [place].  _______________________________________________________________________

## 2018-02-14 ENCOUNTER — Other Ambulatory Visit: Payer: Self-pay

## 2018-02-14 ENCOUNTER — Encounter (HOSPITAL_COMMUNITY): Payer: Self-pay

## 2018-02-14 ENCOUNTER — Encounter (HOSPITAL_COMMUNITY)
Admission: RE | Admit: 2018-02-14 | Discharge: 2018-02-14 | Disposition: A | Discharge status: Home / Self Care | Payer: BC Managed Care – PPO | Source: Ambulatory Visit | Attending: Urology | Admitting: Urology

## 2018-02-14 DIAGNOSIS — Z01818 Encounter for other preprocedural examination: Secondary | ICD-10-CM | POA: Insufficient documentation

## 2018-02-14 DIAGNOSIS — N289 Disorder of kidney and ureter, unspecified: Secondary | ICD-10-CM | POA: Insufficient documentation

## 2018-02-14 LAB — BASIC METABOLIC PANEL
ANION GAP: 9 (ref 5–15)
BUN: 19 mg/dL (ref 6–20)
CHLORIDE: 107 mmol/L (ref 101–111)
CO2: 25 mmol/L (ref 22–32)
Calcium: 9.3 mg/dL (ref 8.9–10.3)
Creatinine, Ser: 1.19 mg/dL — ABNORMAL HIGH (ref 0.44–1.00)
GFR calc non Af Amer: 52 mL/min — ABNORMAL LOW (ref >60)
Glucose, Bld: 92 mg/dL (ref 65–99)
POTASSIUM: 4.5 mmol/L (ref 3.5–5.1)
Sodium: 141 mmol/L (ref 135–145)

## 2018-02-14 LAB — CBC
HEMATOCRIT: 38.5 % (ref 36.0–46.0)
HEMOGLOBIN: 12.3 g/dL (ref 12.0–15.0)
MCH: 30.7 pg (ref 26.0–34.0)
MCHC: 31.9 g/dL (ref 30.0–36.0)
MCV: 96 fL (ref 78.0–100.0)
Platelets: 332 10*3/uL (ref 150–400)
RBC: 4.01 MIL/uL (ref 3.87–5.11)
RDW: 13.2 % (ref 11.5–15.5)
WBC: 7.1 10*3/uL (ref 4.0–10.5)

## 2018-02-14 LAB — ABO/RH: ABO/RH(D): B POS

## 2018-02-15 NOTE — H&P (Signed)
Office Visit Report     01/22/2018   --------------------------------------------------------------------------------   Derl Barrow L. Novitsky  MRN: [id number]  PRIMARY CARE:  Sharilyn Sites, MD  DOB: 1966/02/24, 52 year old Female  REFERRING:  Wandra Mannan, FNP  SSN: -[id number]  PROVIDER:  Kathie Rhodes, M.D.    TREATING:  Raynelle Bring, M.D.    LOCATION:  [facility] 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 Kathie Rhodes   Ms. Buxbaum is a 52 year old female who is seen at the request of Dr. Kathie Rhodes to consider minimally invasive surgical management of her unusual urologic problem. She initially presented to the ED in April of 2017 with complaints of vague abdominal and right flank pain. A CT scan at that time indicated a large proximal ureteral stone with right hydronephrosis and very thinned renal parenchyma indicating a chronic process. She had no evidence of any infectious symptoms including no fever. Urine cultures were negative. She was taken to the OR by Dr. Karsten Ro on 03/31/16 for attempted stent placement and possible stone treatment but the stone was found to be completed impacted with no contrast able to be seen proximal to the stone and inadequate visualization of the stone itself. An attempt a percutaneous nephrostomy placement by IR on 04/04/16 was unsuccessful in gaining access to the collecting system. A yellowish fluid was apparently aspirated and cultures were negative. A renogram in July 2017 confirmed no function of the right kidney. Considering that she was asymptomatic and had no preserved function, she was observed. In January 2019, she began having recurrent pain symptoms including in her pelvis. A CT scan in January 2019 demonstrated a psoas abscess and a second large superficial pelvic abscess with a  large calcification in it. The previously noted large proximal ureteral stone was no longer present. IR placed percutaneous drains into both the psoas abscess and the pelvic abscess. Cultures again were sterile. A subsequent injection of contrast into the psoas abscess drain demonstrated communication into the pelvis into the large pelvic abscess cavity suggesting that the previously noted impacted stone may have migrated into the Retroperitoneum and down into the pelvis somehow. Creatinine levels of the pelvic fluid collection have been consistent with serum.   In addition, she has been noted to have left-sided renal calculi. She was recently taken to the operating room by Dr. Karsten Ro for ureteroscopy. One stone was unable to be accessed at all and another was felt to be in a calyceal diverticulum. Ultimately, neither was felt to be at risk for developing ureteral obstruction of her solitary functional left kidney. She has never undergone a metabolic evaluation for stone disease. She has no family history of stone disease.   She presents today to discuss definitive surgical management. Current leak, she denies any significant abdominal or pelvic pain. She continues to have an indwelling drain in her superficial pelvic abscess that appears to be located superficial to the rectus muscle on imaging. She has denied any recent fevers or other signs or symptoms of infection. She feels that she has been voiding relatively well.   She is seen in consultation today to discuss possible minimally invasive surgical therapy for the above situation.     ALLERGIES: Penicillins    MEDICATIONS: [place] Greaseless 15 %-10 % cream External  Ferrous Sulfate 325 mg (65 mg iron) tablet Oral  Ibuprofen 200 mg tablet Oral  Multi-Vitamin With Minerals  7.5 mg iron-400 mcg tablet Oral  Vitamin D3 1,000 unit tablet Oral     GU PSH: Cystoscopy - 12/28/2017 Cystoscopy Insert Stent, Left - 12/21/2017 Cystoscopy Ureteroscopy,  Left - 12/21/2017, 04/06/2016 Remove Fallopian Tube - 03/30/2016      PSH Notes: Cystoscopy With Ureteroscopy Right, Esophagogastroduodenoscopy, Complete Colonoscopy, Tubal Ligation, Salpingectomy, Excision Of Neuroma   NON-GU PSH: Diagnostic Colonoscopy - 03/30/2016 Tubal Ligation - 03/30/2016    GU PMH: Renal atrophy, Right, 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 This has resulted in extrusion of a stone that was previously located at the UPJ and then resulted in an abscess tracked that tracked down the anterior surface of her psoas muscle and ended up some how in an abscess pocket in the subcutaneous tissue in the midline in the suprapubic region. - 12/28/2017 Renal calculus (Stable), Left, I have discussed with her proceeding with ureteroscopy and management of her left renal calculi with laser lithotripsy and stent placement in order to protect her single renal unit. I went over the procedure with her in detail including the risks and complications, the alternatives, the outpatient nature of the procedure as well as the probability of success and anticipated postoperative course. She understands the procedure and the rationale behind treating this side 1st. - 12/05/2017, Renal calculus, bilateral, - 03/31/2016 Ureteral stricture (Stable), Right, In fact her right ureteral obstruction is secondary to an impacted stone. - 05/29/2016 Hydronephrosis Unspec, Hydronephrosis, right - 03/31/2016 Oligomenorrhea, unspecified, Oligomenorrhea - 03/30/2016 Personal Hx Oth Urinary System diseases, History of renal failure - 03/30/2016      PMH Notes: Right ureteral obstruction: She was seen in the ER on 02/21/16 with vague moderate right upper and lower abdominal pain with anorexia. She said she began having pain in her back in 3/17. A CT scan revealed an obstructing right ureteral stone with marked thinning of the renal parenchyma and a perinephric fluid collection.  03/31/16 - Right  retrograde pyelogram revealed no contrast would pass beyond the stone and I could not get a guidewire past the stone which was obviously impacted.  04/04/16 - Attempted right percutaneous nephrostomy tube placement. The interventional radiologist could not identify any obvious collecting system but was able to obtain yellow fluid from an area that he suspected was an upper pole calyx. No drain was left and culture of the fluid proved negative.   Bilateral renal calculi: By CT scan 4/17 she was noted to have several stones on the left that appeared to possibly be obstructing the infund[place] that they are associated with. On the right hand side she has marked hydronephrosis with thinning of the parenchyma and a perinephric fluid collection displacing the kidney anteriorly with multiple loculations suggesting a possible forniceal rupture or perinephric hematoma. She has several very large stones located in the area of the right renal pelvis/UPJ with Hounsfield units of approximately 650.   NON-GU PMH: Cutaneous abscess of other sites (Stable), Her psoas abscess appears to communicate with the abscess in her suprapubic region. - 12/05/2017 Encounter for general adult medical examination without abnormal findings, Encounter for preventive health examination - 03/31/2016 Minor contusion of unspecified kidney, initial encounter, Perinephric hematoma - 03/31/2016 Encounter for full-term uncomplicated delivery, NSVD (normal spontaneous vaginal delivery) - 03/30/2016 Personal history of diseases of the blood and blood-forming organs and certain disorders involving the immune mechanism, History of anemia - 03/30/2016 Personal history of diseases of the skin and subcutaneous tissue, History of eczema - 03/30/2016 Personal  history of gestational diabetes, History of gestational diabetes mellitus (GDM) - 03/30/2016 Unspecified ectopic pregnancy without intrauterine pregnancy, Ectopic pregnancy - 03/30/2016    FAMILY  HISTORY: Diabetes - Runs In Family malignant neoplasm of prostate - Runs In Family No pertinent family history - Other   SOCIAL HISTORY: Marital Status: Married Preferred Language: English; Ethnicity: Not Hispanic Or Latino; Race: White     Notes: Occupation, Married, Alcohol use, Number of children, Caffeine use, Never a smoker   REVIEW OF SYSTEMS:    GU Review Female:   Patient denies frequent urination, hard to postpone urination, burning /pain with urination, get up at night to urinate, leakage of urine, stream starts and stops, trouble starting your stream, have to strain to urinate, and currently pregnant.  Gastrointestinal (Upper):   Patient denies nausea and vomiting.  Gastrointestinal (Lower):   Patient denies diarrhea and constipation.  Constitutional:   Patient denies fever, night sweats, fatigue, and weight loss.  Skin:   Patient denies skin rash/ lesion and itching.  Eyes:   Patient denies blurred vision and double vision.  Ears/ Nose/ Throat:   Patient denies sore throat and sinus problems.  Hematologic/Lymphatic:   Patient denies swollen glands and easy bruising.  Cardiovascular:   Patient denies leg swelling and chest pains.  Respiratory:   Patient denies cough and shortness of breath.  Endocrine:   Patient denies excessive thirst.  Musculoskeletal:   Patient denies back pain and joint pain.  Neurological:   Patient denies headaches and dizziness.  Psychologic:   Patient denies depression and anxiety.   VITAL SIGNS:      01/22/2018 08:32 AM  Weight 176 lb / 79.83 kg  Height 53 in / 134.62 cm  BP 118/77 mmHg  Pulse 62 /min  BMI 44.0 kg/m   MULTI-SYSTEM PHYSICAL EXAMINATION:    Constitutional: Well-nourished. No physical deformities. Normally developed. Good grooming.  Neck: Neck symmetrical, not swollen. Normal tracheal position.  Respiratory: No labored breathing, no use of accessory muscles. Normal breath sounds. Clear bilaterally.  Cardiovascular: Regular rate  and rhythm. No murmur, no gallop. Normal temperature, normal extremity pulses, no swelling, no varicosities.  Lymphatic: No enlargement of neck, axillae, groin.  Skin: No paleness, no jaundice, no cyanosis. No lesion, no ulcer, no rash.  Neurologic / Psychiatric: Oriented to time, oriented to place, oriented to person. No depression, no anxiety, no agitation.  Gastrointestinal: No mass, no tenderness, no rigidity, non obese abdomen.  Eyes: Normal conjunctivae. Normal eyelids.  Ears, Nose, Mouth, and Throat: Left ear no scars, no lesions, no masses. Right ear no scars, no lesions, no masses. Nose no scars, no lesions, no masses. Normal hearing. Normal lips.  Musculoskeletal: Normal gait and station of head and neck.     PAST DATA REVIEWED:  Source Of History:  Patient  Records Review:   Previous Patient Records  Urine Test Review:   Urinalysis  Notes:                     I have reviewed her multiple CT scans and interventional radiology studies. Findings are as dictated above.   PROCEDURES: None   ASSESSMENT:      ICD-10 Details  1 GU:   Renal atrophy - N27.0 Right  3   Hydronephrosis Unspec - N13.30   2 NON-GU:   Cutaneous abscess of other sites - L02.818    PLAN:           Orders Labs Culture, Wound, Urine Culture,  BMP, CBC with Diff          Schedule Return Visit/Planned Activity: Keep Scheduled Appointment          Document Letter(s):  Created for Patient: Clinical Summary         Notes:   1. 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 Berling. She is quite an unusual situation with suggestion that she had an obstructing and impacted proximal right ureteral calculus that extruded into the retroperitoneum and potentially tract down an abscess cavity into the superficial pelvic abscess that she now has. Considering the fact that she has had chronic infection along with a  nonfunctional kidney, I did recommend that she strongly consider a right nephrectomy with exploration of her retroperitoneum along with removal of her stones in the superficial pelvic abscess and irrigation and debridement of this area to help her resolved her chronic infection.   We discussed the planned procedure in detail which would include a right robot assisted laparoscopic radical nephrectomy considering the likelihood that this surgery will be quite complex. I would also plan to remove the ureter as distally as possible without entry into the bladder. If, at the time of surgery, her superficial abscess does not appear to have clear communication with the peritoneal cavity, I would plan to simply remove the kidney and then proceed with a 2nd lower midline incision to open this cavity, remove her stones, and irrigate and debride tissue is necessary. We would likely leave this lower incision open to heal with secondary intention to minimize her risk of ongoing infection. She understands that she may require drains postoperatively. She understands the potential risk of developing infection postoperatively.   She understands some of the uncertainty with regard to this planned procedure considering her unusual situation. We did review the potential risks including but not limited to bleeding, infection, major cardiopulmonary risks associated with general anesthesia and major surgery, the risks of damage to adjacent structures including the liver, gallbladder, colon, bowel, major neurovascular structures, urinary tract, etc. We reviewed the potential risk of worsening renal function although this is unlikely considering her study suggesting poor right renal function. She understands that the postoperative recovery and hospital course is somewhat difficult to predict although she is prepared to be in the hospital least for a couple of days and potentially go home with postoperative drains and needing wound care  is necessary. All questions were answered to her stated satisfaction. She has been scheduled to proceed on April 8th.   She will have both a culture from her drain as well as a urine culture obtained today. She will have a basic metabolic panel and CBC drawn today. Perioperative antibiotic recommendations will be pending his cultures.   Cc: Dr. Kathie Rhodes  Dr. Sharilyn Sites         Next Appointment:      Next Appointment: 02/18/2018 11:30 AM    Appointment Type: Surgery     Location: [facility] [phone]    Provider: Raynelle Bring, M.D.    Reason for Visit: WL/IP RT RA LAP NEPHROURETERECTOMY WITH AMANDA      CARE TEAM: Raynelle Bring, M.D. Sidonie Dickens     * Signed by Raynelle Bring, M.D. on 01/22/18 at 6:39 PM (EDT)*       APPENDED NOTES:  Based on cultures, will start doxycycline pre-op and she will receive Vancomycin and Gentamcin at time  of surgery.     * Signed by Raynelle Bring, M.D. on 01/27/18 at 10:12 AM (EDT)*

## 2018-02-17 MED ORDER — GENTAMICIN SULFATE 40 MG/ML IJ SOLN
5.0000 mg/kg | Freq: Once | INTRAVENOUS | Status: AC
  Administered 2018-02-18: 300 mg via INTRAVENOUS
  Filled 2018-02-17: qty 7.5

## 2018-02-18 ENCOUNTER — Encounter (HOSPITAL_COMMUNITY): Payer: Self-pay | Admitting: Certified Registered"

## 2018-02-18 ENCOUNTER — Inpatient Hospital Stay (HOSPITAL_COMMUNITY)
Admission: RE | Admit: 2018-02-18 | Discharge: 2018-02-21 | DRG: 660 | Disposition: A | Discharge status: Home / Self Care | Payer: BC Managed Care – PPO | Source: Ambulatory Visit | Attending: Urology | Admitting: Urology

## 2018-02-18 ENCOUNTER — Encounter (HOSPITAL_COMMUNITY)
Admission: RE | Disposition: A | Discharge status: Home / Self Care | Payer: Self-pay | Source: Ambulatory Visit | Attending: Urology

## 2018-02-18 ENCOUNTER — Inpatient Hospital Stay (HOSPITAL_COMMUNITY): Payer: BC Managed Care – PPO | Admitting: Certified Registered"

## 2018-02-18 ENCOUNTER — Other Ambulatory Visit: Payer: Self-pay

## 2018-02-18 DIAGNOSIS — N739 Female pelvic inflammatory disease, unspecified: Secondary | ICD-10-CM | POA: Diagnosis present

## 2018-02-18 DIAGNOSIS — N136 Pyonephrosis: Secondary | ICD-10-CM | POA: Diagnosis present

## 2018-02-18 DIAGNOSIS — Z880 Allergy status to penicillin: Secondary | ICD-10-CM

## 2018-02-18 DIAGNOSIS — Z833 Family history of diabetes mellitus: Secondary | ICD-10-CM | POA: Diagnosis not present

## 2018-02-18 DIAGNOSIS — Z5331 Laparoscopic surgical procedure converted to open procedure: Secondary | ICD-10-CM

## 2018-02-18 DIAGNOSIS — L02211 Cutaneous abscess of abdominal wall: Secondary | ICD-10-CM | POA: Diagnosis present

## 2018-02-18 DIAGNOSIS — Z8042 Family history of malignant neoplasm of prostate: Secondary | ICD-10-CM | POA: Diagnosis not present

## 2018-02-18 HISTORY — PX: ROBOT ASSITED LAPAROSCOPIC NEPHROURETERECTOMY: SHX6077

## 2018-02-18 LAB — BASIC METABOLIC PANEL
Anion gap: 10 (ref 5–15)
BUN: 20 mg/dL (ref 6–20)
CALCIUM: 8.3 mg/dL — AB (ref 8.9–10.3)
CHLORIDE: 108 mmol/L (ref 101–111)
CO2: 19 mmol/L — AB (ref 22–32)
CREATININE: 1.29 mg/dL — AB (ref 0.44–1.00)
GFR calc Af Amer: 55 mL/min — ABNORMAL LOW (ref >60)
GFR calc non Af Amer: 47 mL/min — ABNORMAL LOW (ref >60)
Glucose, Bld: 190 mg/dL — ABNORMAL HIGH (ref 65–99)
Potassium: 4.4 mmol/L (ref 3.5–5.1)
Sodium: 137 mmol/L (ref 135–145)

## 2018-02-18 LAB — POCT I-STAT 4, (NA,K, GLUC, HGB,HCT)
GLUCOSE: 167 mg/dL — AB (ref 65–99)
HCT: 31 % — ABNORMAL LOW (ref 36.0–46.0)
HEMOGLOBIN: 10.5 g/dL — AB (ref 12.0–15.0)
POTASSIUM: 4.3 mmol/L (ref 3.5–5.1)
SODIUM: 139 mmol/L (ref 135–145)

## 2018-02-18 LAB — HEMOGLOBIN AND HEMATOCRIT, BLOOD
HCT: 35.8 % — ABNORMAL LOW (ref 36.0–46.0)
HEMOGLOBIN: 11.8 g/dL — AB (ref 12.0–15.0)

## 2018-02-18 LAB — PREPARE RBC (CROSSMATCH)

## 2018-02-18 SURGERY — ROBOT ASSITED LAPAROSCOPIC NEPHROURETERECTOMY
Anesthesia: General | Laterality: Right

## 2018-02-18 MED ORDER — MAGNESIUM CITRATE PO SOLN
1.0000 | Freq: Once | ORAL | Status: AC
  Administered 2018-02-18: 1 via ORAL
  Filled 2018-02-18: qty 296

## 2018-02-18 MED ORDER — ACETAMINOPHEN 10 MG/ML IV SOLN
1000.0000 mg | Freq: Four times a day (QID) | INTRAVENOUS | Status: AC
  Administered 2018-02-18 – 2018-02-19 (×4): 1000 mg via INTRAVENOUS
  Filled 2018-02-18 (×4): qty 100

## 2018-02-18 MED ORDER — VANCOMYCIN HCL IN DEXTROSE 750-5 MG/150ML-% IV SOLN
750.0000 mg | Freq: Once | INTRAVENOUS | Status: AC
  Administered 2018-02-19: 750 mg via INTRAVENOUS
  Filled 2018-02-18: qty 150

## 2018-02-18 MED ORDER — ONDANSETRON HCL 4 MG/2ML IJ SOLN
4.0000 mg | INTRAMUSCULAR | Status: DC | PRN
  Administered 2018-02-19 – 2018-02-21 (×2): 4 mg via INTRAVENOUS
  Filled 2018-02-18 (×2): qty 2

## 2018-02-18 MED ORDER — MEPERIDINE HCL 50 MG/ML IJ SOLN: 6.2500 mg | INTRAMUSCULAR | Status: DC | PRN

## 2018-02-18 MED ORDER — ALBUMIN HUMAN 5 % IV SOLN
INTRAVENOUS | Status: AC
  Filled 2018-02-18: qty 250

## 2018-02-18 MED ORDER — GENTAMICIN SULFATE 40 MG/ML IJ SOLN
5.0000 mg/kg | Freq: Once | INTRAVENOUS | Status: AC
  Administered 2018-02-19: 300 mg via INTRAVENOUS
  Filled 2018-02-18: qty 7.5

## 2018-02-18 MED ORDER — DEXAMETHASONE SODIUM PHOSPHATE 10 MG/ML IJ SOLN
INTRAMUSCULAR | Status: AC
  Filled 2018-02-18: qty 1

## 2018-02-18 MED ORDER — INDOCYANINE GREEN 25 MG IV SOLR
INTRAVENOUS | Status: DC | PRN
  Administered 2018-02-18: 5 mg via INTRAVENOUS

## 2018-02-18 MED ORDER — FENTANYL CITRATE (PF) 100 MCG/2ML IJ SOLN
INTRAMUSCULAR | Status: DC | PRN
  Administered 2018-02-18 (×4): 50 ug via INTRAVENOUS
  Administered 2018-02-18: 100 ug via INTRAVENOUS
  Administered 2018-02-18 (×6): 50 ug via INTRAVENOUS

## 2018-02-18 MED ORDER — FENTANYL CITRATE (PF) 250 MCG/5ML IJ SOLN
INTRAMUSCULAR | Status: AC
  Filled 2018-02-18: qty 5

## 2018-02-18 MED ORDER — PROPOFOL 10 MG/ML IV BOLUS
INTRAVENOUS | Status: DC | PRN
  Administered 2018-02-18: 120 mg via INTRAVENOUS

## 2018-02-18 MED ORDER — ROCURONIUM BROMIDE 10 MG/ML (PF) SYRINGE
PREFILLED_SYRINGE | INTRAVENOUS | Status: AC
  Filled 2018-02-18: qty 5

## 2018-02-18 MED ORDER — SODIUM CHLORIDE 0.9 % IV SOLN
Freq: Once | INTRAVENOUS | Status: AC
  Administered 2018-02-18: 17:00:00 via INTRAVENOUS

## 2018-02-18 MED ORDER — ROCURONIUM BROMIDE 10 MG/ML (PF) SYRINGE
PREFILLED_SYRINGE | INTRAVENOUS | Status: DC | PRN
  Administered 2018-02-18 (×3): 10 mg via INTRAVENOUS
  Administered 2018-02-18: 50 mg via INTRAVENOUS
  Administered 2018-02-18: 10 mg via INTRAVENOUS

## 2018-02-18 MED ORDER — ACETAMINOPHEN 10 MG/ML IV SOLN
INTRAVENOUS | Status: AC
  Filled 2018-02-18: qty 100

## 2018-02-18 MED ORDER — FLEET ENEMA 7-19 GM/118ML RE ENEM
1.0000 | ENEMA | Freq: Once | RECTAL | Status: AC
  Administered 2018-02-18: 1 via RECTAL
  Filled 2018-02-18: qty 1

## 2018-02-18 MED ORDER — ALBUMIN HUMAN 5 % IV SOLN
INTRAVENOUS | Status: DC | PRN
  Administered 2018-02-18: 16:00:00 via INTRAVENOUS

## 2018-02-18 MED ORDER — SODIUM CHLORIDE 0.9% FLUSH: 9.0000 mL | INTRAVENOUS | Status: DC | PRN

## 2018-02-18 MED ORDER — HYDROMORPHONE HCL 1 MG/ML IJ SOLN
0.2500 mg | INTRAMUSCULAR | Status: DC | PRN
  Administered 2018-02-18: 0.5 mg via INTRAVENOUS
  Administered 2018-02-18: 0.25 mg via INTRAVENOUS
  Administered 2018-02-18: 0.5 mg via INTRAVENOUS

## 2018-02-18 MED ORDER — ONDANSETRON HCL 4 MG/2ML IJ SOLN
INTRAMUSCULAR | Status: DC | PRN
  Administered 2018-02-18 (×2): 4 mg via INTRAVENOUS

## 2018-02-18 MED ORDER — DIPHENHYDRAMINE HCL 12.5 MG/5ML PO ELIX: 12.5000 mg | ORAL_SOLUTION | Freq: Four times a day (QID) | ORAL | Status: DC | PRN

## 2018-02-18 MED ORDER — FENTANYL CITRATE (PF) 100 MCG/2ML IJ SOLN
INTRAMUSCULAR | Status: AC
  Filled 2018-02-18: qty 2

## 2018-02-18 MED ORDER — STERILE WATER FOR IRRIGATION IR SOLN
Status: DC | PRN
  Administered 2018-02-18: 1000 mL

## 2018-02-18 MED ORDER — NALOXONE HCL 0.4 MG/ML IJ SOLN: 0.4000 mg | INTRAMUSCULAR | Status: DC | PRN

## 2018-02-18 MED ORDER — DEXAMETHASONE SODIUM PHOSPHATE 10 MG/ML IJ SOLN
INTRAMUSCULAR | Status: DC | PRN
  Administered 2018-02-18: 10 mg via INTRAVENOUS

## 2018-02-18 MED ORDER — HYDROMORPHONE HCL 1 MG/ML IJ SOLN
INTRAMUSCULAR | Status: AC
  Filled 2018-02-18: qty 1

## 2018-02-18 MED ORDER — SODIUM CHLORIDE 0.9 % IV SOLN
INTRAVENOUS | Status: AC
  Filled 2018-02-18 (×2): qty 500000

## 2018-02-18 MED ORDER — ONDANSETRON HCL 4 MG/2ML IJ SOLN
INTRAMUSCULAR | Status: AC
  Filled 2018-02-18: qty 2

## 2018-02-18 MED ORDER — KETOROLAC TROMETHAMINE 15 MG/ML IJ SOLN
15.0000 mg | Freq: Four times a day (QID) | INTRAMUSCULAR | Status: DC
  Administered 2018-02-18 – 2018-02-21 (×10): 15 mg via INTRAVENOUS
  Filled 2018-02-18 (×10): qty 1

## 2018-02-18 MED ORDER — DIPHENHYDRAMINE HCL 50 MG/ML IJ SOLN: 12.5000 mg | Freq: Four times a day (QID) | INTRAMUSCULAR | Status: DC | PRN

## 2018-02-18 MED ORDER — SODIUM CHLORIDE 0.9 % IV SOLN
INTRAVENOUS | Status: DC | PRN
  Administered 2018-02-18: 1000 mL

## 2018-02-18 MED ORDER — LACTATED RINGERS IR SOLN
Status: DC | PRN
  Administered 2018-02-18: 1000 mL

## 2018-02-18 MED ORDER — ONDANSETRON HCL 4 MG/2ML IJ SOLN: 4.0000 mg | Freq: Once | INTRAMUSCULAR | Status: DC | PRN

## 2018-02-18 MED ORDER — BUPIVACAINE LIPOSOME 1.3 % IJ SUSP: 20.0000 mL | Freq: Once | INTRAMUSCULAR | Status: DC

## 2018-02-18 MED ORDER — SUGAMMADEX SODIUM 200 MG/2ML IV SOLN
INTRAVENOUS | Status: AC
  Filled 2018-02-18: qty 2

## 2018-02-18 MED ORDER — LACTATED RINGERS IV SOLN
INTRAVENOUS | Status: DC
  Administered 2018-02-18 (×5): via INTRAVENOUS

## 2018-02-18 MED ORDER — LIDOCAINE 2% (20 MG/ML) 5 ML SYRINGE
INTRAMUSCULAR | Status: AC
  Filled 2018-02-18: qty 5

## 2018-02-18 MED ORDER — HYDROCODONE-ACETAMINOPHEN 5-325 MG PO TABS: 1.0000 | ORAL_TABLET | Freq: Four times a day (QID) | ORAL | 0 refills | Status: AC | PRN

## 2018-02-18 MED ORDER — KCL IN DEXTROSE-NACL 20-5-0.45 MEQ/L-%-% IV SOLN
INTRAVENOUS | Status: DC
  Administered 2018-02-18: 23:00:00 via INTRAVENOUS
  Administered 2018-02-19: 125 mL/h via INTRAVENOUS
  Administered 2018-02-19 – 2018-02-20 (×2): via INTRAVENOUS
  Filled 2018-02-18 (×5): qty 1000

## 2018-02-18 MED ORDER — DOCUSATE SODIUM 100 MG PO CAPS
100.0000 mg | ORAL_CAPSULE | Freq: Two times a day (BID) | ORAL | Status: DC
  Administered 2018-02-18 – 2018-02-21 (×6): 100 mg via ORAL
  Filled 2018-02-18 (×6): qty 1

## 2018-02-18 MED ORDER — SODIUM CHLORIDE 0.9 % IJ SOLN
INTRAMUSCULAR | Status: AC
  Filled 2018-02-18: qty 20

## 2018-02-18 MED ORDER — BUPIVACAINE LIPOSOME 1.3 % IJ SUSP
20.0000 mL | Freq: Once | INTRAMUSCULAR | Status: AC
  Administered 2018-02-18: 20 mL
  Filled 2018-02-18 (×2): qty 20

## 2018-02-18 MED ORDER — ENOXAPARIN SODIUM 40 MG/0.4ML ~~LOC~~ SOLN
40.0000 mg | SUBCUTANEOUS | Status: DC
  Administered 2018-02-19 – 2018-02-20 (×2): 40 mg via SUBCUTANEOUS
  Filled 2018-02-18 (×2): qty 0.4

## 2018-02-18 MED ORDER — PHENYLEPHRINE HCL 10 MG/ML IJ SOLN: INTRAMUSCULAR | Status: DC | PRN

## 2018-02-18 MED ORDER — OXYCODONE HCL 5 MG PO TABS
5.0000 mg | ORAL_TABLET | ORAL | Status: DC | PRN
  Administered 2018-02-19 – 2018-02-20 (×5): 5 mg via ORAL
  Filled 2018-02-18 (×5): qty 1

## 2018-02-18 MED ORDER — HYDROMORPHONE 1 MG/ML IV SOLN
INTRAVENOUS | Status: DC
  Administered 2018-02-18: 1.6 mg via INTRAVENOUS
  Administered 2018-02-18: 20:00:00 via INTRAVENOUS
  Administered 2018-02-18: 0.3 mg via INTRAVENOUS
  Administered 2018-02-19: 0 mg via INTRAVENOUS
  Administered 2018-02-19: 1.4 mg via INTRAVENOUS
  Administered 2018-02-19: 0.2 mg via INTRAVENOUS
  Administered 2018-02-20: 0 mg via INTRAVENOUS
  Filled 2018-02-18: qty 25

## 2018-02-18 MED ORDER — VANCOMYCIN HCL IN DEXTROSE 1-5 GM/200ML-% IV SOLN
1000.0000 mg | Freq: Once | INTRAVENOUS | Status: AC
  Administered 2018-02-18: 1000 mg via INTRAVENOUS
  Filled 2018-02-18: qty 200

## 2018-02-18 MED ORDER — MIDAZOLAM HCL 2 MG/2ML IJ SOLN
INTRAMUSCULAR | Status: AC
  Filled 2018-02-18: qty 2

## 2018-02-18 MED ORDER — MIDAZOLAM HCL 5 MG/5ML IJ SOLN
INTRAMUSCULAR | Status: DC | PRN
  Administered 2018-02-18: 2 mg via INTRAVENOUS

## 2018-02-18 MED ORDER — SODIUM CHLORIDE 0.9 % IJ SOLN
INTRAMUSCULAR | Status: DC | PRN
  Administered 2018-02-18: 20 mL

## 2018-02-18 MED ORDER — PROPOFOL 10 MG/ML IV BOLUS
INTRAVENOUS | Status: AC
  Filled 2018-02-18: qty 20

## 2018-02-18 MED ORDER — SUGAMMADEX SODIUM 200 MG/2ML IV SOLN
INTRAVENOUS | Status: DC | PRN
  Administered 2018-02-18: 150 mg via INTRAVENOUS

## 2018-02-18 MED ORDER — LIDOCAINE 2% (20 MG/ML) 5 ML SYRINGE
INTRAMUSCULAR | Status: DC | PRN
  Administered 2018-02-18: 100 mg via INTRAVENOUS

## 2018-02-18 SURGICAL SUPPLY — 74 items
BAG DECANTER FOR FLEXI CONT (MISCELLANEOUS) ×3 IMPLANT
BAG LAPAROSCOPIC 12 15 PORT 16 (BASKET) ×1 IMPLANT
BAG RETRIEVAL 12/15 (BASKET) ×2
BAG RETRIEVAL 12/15MM (BASKET) ×1
BLADE EXTENDED COATED 6.5IN (ELECTRODE) ×3 IMPLANT
BNDG GAUZE ELAST 4 BULKY (GAUZE/BANDAGES/DRESSINGS) ×3 IMPLANT
CHLORAPREP W/TINT 26ML (MISCELLANEOUS) ×6 IMPLANT
CLIP VESOLOCK LG 6/CT PURPLE (CLIP) ×6 IMPLANT
CLIP VESOLOCK MED LG 6/CT (CLIP) ×6 IMPLANT
CONT SPEC 4OZ CLIKSEAL STRL BL (MISCELLANEOUS) ×3 IMPLANT
COVER SURGICAL LIGHT HANDLE (MISCELLANEOUS) ×3 IMPLANT
COVER TIP SHEARS 8 DVNC (MISCELLANEOUS) ×1 IMPLANT
COVER TIP SHEARS 8MM DA VINCI (MISCELLANEOUS) ×2
CUTTER ECHEON FLEX ENDO 45 340 (ENDOMECHANICALS) ×3 IMPLANT
CUTTER FLEX LINEAR 45M (STAPLE) ×3 IMPLANT
DECANTER SPIKE VIAL GLASS SM (MISCELLANEOUS) IMPLANT
DERMABOND ADVANCED (GAUZE/BANDAGES/DRESSINGS) ×4
DERMABOND ADVANCED .7 DNX12 (GAUZE/BANDAGES/DRESSINGS) ×2 IMPLANT
DRAIN CHANNEL 15F RND FF 3/16 (WOUND CARE) ×3 IMPLANT
DRAIN CHANNEL 19F RND (DRAIN) ×3 IMPLANT
DRAPE ARM DVNC X/XI (DISPOSABLE) ×4 IMPLANT
DRAPE COLUMN DVNC XI (DISPOSABLE) ×1 IMPLANT
DRAPE DA VINCI XI ARM (DISPOSABLE) ×8
DRAPE DA VINCI XI COLUMN (DISPOSABLE) ×2
DRAPE INCISE IOBAN 66X45 STRL (DRAPES) ×3 IMPLANT
DRAPE LAPAROSCOPIC ABDOMINAL (DRAPES) ×3 IMPLANT
DRAPE SHEET LG 3/4 BI-LAMINATE (DRAPES) ×3 IMPLANT
DRAPE WARM FLUID 44X44 (DRAPE) ×3 IMPLANT
DRSG OPSITE POSTOP 3X4 (GAUZE/BANDAGES/DRESSINGS) ×3 IMPLANT
DRSG OPSITE POSTOP 4X12 (GAUZE/BANDAGES/DRESSINGS) ×3 IMPLANT
DRSG TEGADERM 4X4.75 (GAUZE/BANDAGES/DRESSINGS) IMPLANT
ELECT PENCIL ROCKER SW 15FT (MISCELLANEOUS) ×3 IMPLANT
ELECT REM PT RETURN 15FT ADLT (MISCELLANEOUS) ×3 IMPLANT
EVACUATOR DRAINAGE 10X20 100CC (DRAIN) ×1 IMPLANT
EVACUATOR SILICONE 100CC (DRAIN) ×2 IMPLANT
GAUZE SPONGE 4X4 12PLY STRL (GAUZE/BANDAGES/DRESSINGS) ×3 IMPLANT
GLOVE BIO SURGEON STRL SZ 6.5 (GLOVE) ×2 IMPLANT
GLOVE BIO SURGEONS STRL SZ 6.5 (GLOVE) ×1
GLOVE BIOGEL M STRL SZ7.5 (GLOVE) ×6 IMPLANT
GOWN STRL REUS W/TWL LRG LVL3 (GOWN DISPOSABLE) ×9 IMPLANT
IRRIG SUCT STRYKERFLOW 2 WTIP (MISCELLANEOUS) ×3
IRRIGATION SUCT STRKRFLW 2 WTP (MISCELLANEOUS) ×1 IMPLANT
KIT BASIN OR (CUSTOM PROCEDURE TRAY) ×3 IMPLANT
LIGASURE IMPACT 36 18CM CVD LR (INSTRUMENTS) ×3 IMPLANT
MARKER SKIN DUAL TIP RULER LAB (MISCELLANEOUS) ×3 IMPLANT
NS IRRIG 1000ML POUR BTL (IV SOLUTION) IMPLANT
PAD ABD 8X10 STRL (GAUZE/BANDAGES/DRESSINGS) ×3 IMPLANT
POSITIONER SURGICAL ARM (MISCELLANEOUS) ×6 IMPLANT
RELOAD 45 VASCULAR/THIN (ENDOMECHANICALS) ×3 IMPLANT
SEAL CANN UNIV 5-8 DVNC XI (MISCELLANEOUS) ×4 IMPLANT
SEAL XI 5MM-8MM UNIVERSAL (MISCELLANEOUS) ×8
SOLUTION ANTI FOG 6CC (MISCELLANEOUS) ×3 IMPLANT
SOLUTION ELECTROLUBE (MISCELLANEOUS) ×3 IMPLANT
SPONGE LAP 18X18 X RAY DECT (DISPOSABLE) ×3 IMPLANT
STAPLE RELOAD 45 WHT (STAPLE) ×1 IMPLANT
STAPLE RELOAD 45MM WHITE (STAPLE) ×2
STAPLER VISISTAT (STAPLE) ×3 IMPLANT
SUT ETHILON 3 0 PS 1 (SUTURE) ×3 IMPLANT
SUT MNCRL AB 4-0 PS2 18 (SUTURE) ×6 IMPLANT
SUT PDS AB 1 CTX 36 (SUTURE) ×6 IMPLANT
SUT PROLENE 4 0 RB 1 (SUTURE) ×4
SUT PROLENE 4-0 RB1 .5 CRCL 36 (SUTURE) ×2 IMPLANT
SUT SILK 2 0 SH (SUTURE) ×12 IMPLANT
SUT V-LOC BARB 180 2/0GR6 GS22 (SUTURE)
SUT VICRYL 0 UR6 27IN ABS (SUTURE) ×6 IMPLANT
SUT VLOC BARB 180 ABS3/0GR12 (SUTURE) ×9
SUTURE V-LC BRB 180 2/0GR6GS22 (SUTURE) IMPLANT
SUTURE VLOC BRB 180 ABS3/0GR12 (SUTURE) ×3 IMPLANT
TOWEL OR NON WOVEN STRL DISP B (DISPOSABLE) ×6 IMPLANT
TRAY FOLEY W/METER SILVER 16FR (SET/KITS/TRAYS/PACK) ×3 IMPLANT
TRAY LAPAROSCOPIC (CUSTOM PROCEDURE TRAY) ×3 IMPLANT
TROCAR XCEL 12X100 BLDLESS (ENDOMECHANICALS) ×3 IMPLANT
WATER STERILE IRR 1000ML POUR (IV SOLUTION) IMPLANT
YANKAUER SUCT BULB TIP 10FT TU (MISCELLANEOUS) ×3 IMPLANT

## 2018-02-18 NOTE — Interval H&P Note (Signed)
History and Physical Interval Note:  02/18/2018 10:49 AM  Sarah Watson  has presented today for surgery, with the diagnosis of NONFUNCTIONING RIGHT KIDNEY  The various methods of treatment have been discussed with the patient and family. After consideration of risks, benefits and other options for treatment, the patient has consented to  Procedure(s): ROBOT ASSITED LAPAROSCOPIC NEPHROURETERECTOMY (Right) as a surgical intervention .  The patient's history has been reviewed, patient examined, no change in status, stable for surgery.  I have reviewed the patient's chart and labs.  Questions were answered to the patient's satisfaction.     Chisum Habenicht,LES

## 2018-02-18 NOTE — Discharge Instructions (Signed)

## 2018-02-18 NOTE — Progress Notes (Signed)
Pharmacy Antibiotic Note  Sarah Watson is a 52 y.o. 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 Patient received a dose of Vancomycin and Gentamicin pre-op. Pharmacy consulted for Vancomycin and Gentamicin dosing post-op for surgical prophylaxis.   Plan: Vancomycin 750mg  IV x 1 on 4/9 at 0300. Gentamicin 300mg  IV x 1 on 4/9 at 1200. Above regimen will provide coverage for 24 hour period post-op for surgical prophylaxis.  Pharmacy will sign off at this time. Please re-consult if require further dosing assistance.   Height: 5\' 3"  (160 cm) Weight: 152 lb 4 oz (69.1 kg) IBW/kg (Calculated) : 52.4  Temp (24hrs), Avg:98 F (36.7 C), Min:97.6 F (36.4 C), Max:98.3 F (36.8 C)  Recent Labs  Lab 02/14/18 1014 02/18/18 1931  WBC 7.1  --   CREATININE 1.19* 1.29*    Estimated Creatinine Clearance: 48.1 mL/min (A) (by C-G formula based on SCr of 1.29 mg/dL (H)).    Allergies  Allergen Reactions  . Penicillins Anaphylaxis and Other (See Comments)    Childhood Has patient had a PCN reaction causing immediate rash, facial/tongue/throat swelling, SOB or lightheadedness with hypotension: Yes Has patient had a PCN reaction causing severe rash involving mucus membranes or skin necrosis: No Has patient had a PCN reaction that required hospitalization: Yes Has patient had a PCN reaction occurring within the last 10 years: No If all of the above answers are "NO", then may proceed with Cephalosporin use.     Antimicrobials this admission: 4/8 >> Vancomycin, Gentamicin peri-operatively >> 4/9  Microbiology results: 4/8 wound culture: sent  Thank you for allowing pharmacy to be a part of this patient's care.   Lindell Spar, PharmD, BCPS Pager: [phone] 02/18/2018 10:55 PM

## 2018-02-18 NOTE — Anesthesia Preprocedure Evaluation (Signed)
Anesthesia Evaluation  Patient identified by MRN, date of birth, ID band Patient awake    Reviewed: Allergy & Precautions, NPO status , Patient's Chart, lab work & pertinent test results  Airway Mallampati: I  TM Distance: >3 FB Neck ROM: Full    Dental   Pulmonary    Pulmonary exam normal        Cardiovascular Normal cardiovascular exam     Neuro/Psych Depression    GI/Hepatic   Endo/Other    Renal/GU      Musculoskeletal   Abdominal   Peds  Hematology   Anesthesia Other Findings   Reproductive/Obstetrics                             Anesthesia Physical Anesthesia Plan  ASA: II  Anesthesia Plan: General   Post-op Pain Management:    Induction: Intravenous  PONV Risk Score and Plan: 3 and Dexamethasone, Ondansetron and Midazolam  Airway Management Planned: Oral ETT  Additional Equipment:   Intra-op Plan:   Post-operative Plan: Extubation in OR  Informed Consent: I have reviewed the patients History and Physical, chart, labs and discussed the procedure including the risks, benefits and alternatives for the proposed anesthesia with the patient or authorized representative who has indicated his/her understanding and acceptance.     Plan Discussed with: CRNA and Surgeon  Anesthesia Plan Comments:         Anesthesia Quick Evaluation

## 2018-02-18 NOTE — Transfer of Care (Signed)
Immediate Anesthesia Transfer of Care Note  Patient: Shelton Soler  Procedure(s) Performed: ROBOT ASSITED LAPAROSCOPIC RIGHT NEPHRECTOMY AND INCISION AND DRAINAGE OF SUPERFICIAL PELVIC ABCESS AND REMOVAL OF CALCULOUS (Right )  Patient Location: PACU  Anesthesia Type:General  Level of Consciousness: awake, alert  and patient cooperative  Airway & Oxygen Therapy: Patient Spontanous Breathing and Patient connected to face mask oxygen  Post-op Assessment: Report given to RN and Post -op Vital signs reviewed and stable  Post vital signs: Reviewed and stable  Last Vitals:  Vitals Value Taken Time  BP 144/78 02/18/2018  7:00 PM  Temp 36.4 C 02/18/2018  6:53 PM  Pulse 66 02/18/2018  7:03 PM  Resp 10 02/18/2018  7:03 PM  SpO2 100 % 02/18/2018  7:03 PM  Vitals shown include unvalidated device data.  Last Pain:  Vitals:   02/18/18 1001  TempSrc:   PainSc: 0-No pain      Patients Stated Pain Goal: 4 (70/17/79 3903)  Complications: No apparent anesthesia complications

## 2018-02-18 NOTE — Op Note (Addendum)
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  Right robot-assisted laparoscopic converted to open simple nephrectomy 2.  Incision and drainage of superficial abdominal/pelvic abscess (4 cm) and removal of migrated ureteral calculus  Please note that this procedure was significantly more complicated and complex than was expected.  Total operative time required was greater than 6 hours.  This was more than 100% the normal expected time for this procedure typically.  The reason for the complexity was due to chronic infection and the obliteration of all normal anatomic planes resulting in extremely tedious and slow dissection to perform this procedure.  Surgeon: Pryor Curia MD  Assistant: Debbrah Alar, PA-C  An assistant was required for this surgical procedure.  The duties of the assistant included but were not limited to suctioning, passing suture, camera manipulation, retraction. This procedure would not be able to be performed without an Environmental consultant.  Anesthesia: General   Resident: Dr. Jeralyn Ruths  EBL: 600 cc  Intravenous fluids: 3 L of crystalloid, 1 unit of packed red blood cells, 250 cc of albumin  Complications: None  Intraoperative findings: The right kidney was noted to be densely adherent to surrounding structures with no normal anatomy appreciated.  Specifically, all tissue planes were obliterated.  There was noted to be purulent material noted from the right kidney consistent with chronic infection.  Within the superficial abdominal/pelvic abscess, 2 calculi were removed.  Specimens: Stone cultures  Disposition of specimens: Microbiology  lab  Indication: Sarah Watson is a 52 year old female with no usual urologic history.  She initially presented with vague and nonspecific abdominal pain in the spring 2017.  Imaging revealed a large right UPJ calculus.  Attempts at retrograde ureteral stenting and antegrade percutaneous nephrostomy tube placement were unsuccessful.  A subsequent renogram demonstrated no significant function from the right kidney.  At that time, she had become asymptomatic.  She therefore underwent observation.  In January 2019, she again presented with new abdominal and pelvic pain complaints.  Imaging revealed a small nonfunctional right kidney.  However, a new large lower abdominal/pelvic abscess superficial to the rectus abdominis.  The previously noted large calculus was now seen within this cavity.  There also was a small retroperitoneal abscess that was percutaneously drained along with the lower abdominal/pelvic abscess.  Injection into the drain site in the retroperitoneum revealed a tract that led to an communicated with the lower abdominal abscess.  After drainage and antibiotic therapy, she presented for further definitive surgical therapy and was referred to me for possible minimally invasive surgery.  We reviewed the potential risks, complications, and the expected recovery process associated with the above procedures.  We specifically discussed the potential risk for open surgical conversion considering her history of chronic infection.  She gave informed consent.  Description of procedure: The patient was taken to the operating room and a general anesthetic was administered.  She was given preoperative antibiotics and also specifically covered her preoperative culture from her drain.  She was placed in the right modified flank position and prepped and draped in the usual sterile fashion.  A preoperative timeout was performed.  Her drain was also prepped in the field although prepped out the main abdominal field.  A  site was then selected in the superior midline for placement of a camera port.  This was placed using an open Hassan technique.  This allowed entry into the peritoneal cavity under direct vision.  Additional ports were then placed with 8 mm robotic ports placed in the right upper quadrant right lower quadrant and right lateral lower quadrant.  The camera port was placed just lateral to the rectus muscle at the level of the umbilicus.  All ports were placed under direct vision without difficulty.  Attention then turned to reflection of the colon.  It was immediately noted that the normal planes between the colon mesentery and the retroperitoneum were not readily apparent.  Careful dissection allowed entry into the retroperitoneum and the ureter and gonadal vein were eventually able to be identified after careful and tedious dissection.  The ureter and gonadal veins were then traced superiorly although no clear plane along the psoas muscle was appreciated.  It became apparent that the ureter though significantly in a medial direction under the colon was quite stuck.  In addition, the gonadal vein was isolated and was able to be divided after ligation with multiple Weck clips.  At this point, it was decided that complete removal of the ureter was not likely feasible.  After inspection of multiple possible tissue planes around the kidney, was apparent that there were no normal anatomic planes.  Dissection then proceeded quite carefully away from the vicinity of the vena cava until a posterior plane was created on what was felt to represent the psoas muscle.  Very careful and very tedious dissection was performed although the kidney appeared to be densely adherent to the surrounding structures and particularly posteriorly.  I was able to create a posterior plane under the lower pole of the kidney although there was some concern as we continued superiorly the renal hilum might be in the near vicinity.  Dissection then  stop there and proceeded laterally.  I was able to perform the lateral dissection with again very careful and very tedious dissection in a very slow but progressive manner.  A combination of sharp dissection and cautery dissection was used.  Blunt dissection was unable to be performed due to the fact that the tissue was so densely adherent.  I then found the plane between Gerota's fascia and the peritoneum superiorly and this allowed for the liver to be retracted as the hepatorenal ligaments were divided.  I was able to create a plane between the adrenal gland in the superior pole of the kidney although neither structures could be easily identified.  This left still yet a large block of tissue surrounding the renal hilum.  Attention then turned to the inferior aspect of this dissection and I continued to carefully try to dissect small portions of tissue.  Although I was well away from the renal hilum, I did encounter significant venous bleeding under the lower pole of the kidney posteriorly over the psoas muscle.  These did not appear to be any of the major renal hilar vessels.  I was able to apply pressure with the left robotic arm.  This temporarily controlled any bleeding.  I then attempted to place a 2-0 Vicryl stitch although this resulted in additional bleeding.  Again pressure was applied and due to the significance of the bleeding, along with the extremely tedious and slow dissection, I made the decision to proceed with open surgical conversion.  Leaving the left robotic arm in place holding pressure and resulting in hemostasis, I disconnected the right robotic arm and camera port.  Before this was done, preparations were  made for open surgical conversion allowing for appropriate instrumentation to be opened.  I also discussed the situation with anesthesia and the patient's type and screen was then changed to a type and cross and we elected to begin giving the patient 1 unit of blood due to the concern for  potential additional blood loss.  I then made a large subcostal incision 2 fingerbreadths inferior to the right costal margin.  This was carried down through the subcutaneous tissues along with the fascial and muscular layers of the abdominal wall.  This allowed entry into the peritoneal cavity.  The pneumoperitoneum was then turned off and all instruments and arms were removed from the patient except for the arm that was holding pressure to provide hemostasis.  A self-retaining Bookwalter retractor had been placed.  I then carefully removed the robotic arm away from the bleeding site and this was controlled with a sponge stick.  The remainder of the surgical cart was then removed from the operative field.  Further inspection revealed mild persistent bleeding that was controlled easily with 2-0 silk figure-of-eight sutures.  At this point, I was able to palpate the remaining tissue.  This was still significant and I was not able to yet secure the renal hilum en bloc.  I therefore proceeded with right angle dissection both superiorly and inferiorly well away from what was felt to be the main renal hilum.  The LigaSure was also used to control any possible bleeding sites.  Ultimately, I was able to narrow down the remaining tissue to an area that would allow for stapling.  Again, neither the renal artery nor renal vein was easily visible although the outline of the renal vein entering the vena cava was somewhat apparent.  I then placed the 45 mm Echelon stapler across the renal hilum and the renal hilum was stapled en bloc and divided.  There was additional tissue and further stapling was performed to remove the remainder of the attached tissue the kidney.  The kidney specimen was then removed.  There was mild additional bleeding in the vicinity of the prior bleeding toward the lower pole of the kidney away from the renal hilum.  This was again controlled with another 2-0 silk figure-of-eight suture.  The  retroperitoneum was then copiously irrigated with antibiotic solution.  A drain was placed and brought out through the right lateral lower quadrant port site and secured to the skin with a nylon suture.  The previously placed 12 mm suture in the upper midline was closed with a 0 Vicryl suture.  The subcostal incision was then closed in 2 layers with running 0 looped PDS suture.  The posterior layer included the transversus abdominis/internal oblique and the posterior rectus muscle.  The anterior layer included the external oblique and anterior rectus fascia.  The skin incision was closed with staples.  In addition, the laparoscopic incisions were also closed with staples.  Attention then turned to the lower abdominal drain.  A small Pfannenstiel incision measuring approximately 6 cm was made extending laterally from either side of the indwelling drain.  This was then carried down through the subcutaneous tissues and immediately into the chronic abscess cavity.  2 stones were identified.  The stones were then placed in saline and crushed and the resultant fluid was sent for culture.  Once it was clear that this cavity was completely open, I could feel a small sinus tract in the right lower quadrant portion of this cavity.  Cautery was used to  obliterate the sinus tract and hemostasis was achieved throughout the incision site.  Double antibiotic solution was used to copiously irrigate this cavity and it was felt that it would be best left open for secondary intention wound healing.  A wet-to-dry Kerlix dressing was therefore applied.  Sterile dressings were applied.  The patient tolerated the procedure well and without unexpected complications.  She was able to be extubated and transferred to the recovery unit in satisfactory condition.

## 2018-02-18 NOTE — Progress Notes (Signed)
Patient ID: Sarah Watson, female   DOB: [birth year], 52 y.o.   MRN: [id number]  Post-op note  Subjective: The patient is doing well.  No complaints. Pain controlled.  Objective: Vital signs in last 24 hours: Temp:  [97.6 F (36.4 C)-98.3 F (36.8 C)] 98.3 F (36.8 C) (04/08 2030) Pulse Rate:  [52-78] 52 (04/08 2015) Resp:  [9-18] 9 (04/08 2015) BP: (121-144)/(75-84) 141/82 (04/08 2030) SpO2:  [100 %] 100 % (04/08 2015) Weight:  [69.1 kg (152 lb 4 oz)] 69.1 kg (152 lb 4 oz) (04/08 1001)  Intake/Output from previous day: No intake/output data recorded. Intake/Output this shift: Total I/O In: 900 [I.V.:900] Out: -   Physical Exam:  General: Alert and oriented. Abdomen: Soft, Nondistended. Incisions: Clean and dry.  Lab Results: Recent Labs    02/18/18 1741 02/18/18 1931  HGB 10.5* 11.8*  HCT 31.0* 35.8*    Assessment/Plan: POD#0   1) Continue to monitor   Pryor Curia. MD   LOS: 0 days   Jaryiah Mehlman,LES 02/18/2018, 8:38 PM

## 2018-02-18 NOTE — Anesthesia Procedure Notes (Signed)
Procedure Name: Intubation Date/Time: 02/18/2018 12:07 PM Performed by: Aric Jost D, CRNA Pre-anesthesia Checklist: Patient identified, Emergency Drugs available, Suction available and Patient being monitored Patient Re-evaluated:Patient Re-evaluated prior to induction Oxygen Delivery Method: Circle system utilized Preoxygenation: Pre-oxygenation with 100% oxygen Induction Type: IV induction Ventilation: Mask ventilation without difficulty Laryngoscope Size: Mac and 4 Grade View: Grade I Tube type: Oral Number of attempts: 1 Airway Equipment and Method: Stylet Placement Confirmation: ETT inserted through vocal cords under direct vision,  positive ETCO2 and breath sounds checked- equal and bilateral Secured at: 21 cm Tube secured with: Tape Dental Injury: Teeth and Oropharynx as per pre-operative assessment

## 2018-02-19 ENCOUNTER — Other Ambulatory Visit: Payer: Self-pay

## 2018-02-19 ENCOUNTER — Encounter (HOSPITAL_COMMUNITY): Payer: Self-pay | Admitting: Urology

## 2018-02-19 LAB — CBC
HEMATOCRIT: 34.4 % — AB (ref 36.0–46.0)
HEMOGLOBIN: 11.2 g/dL — AB (ref 12.0–15.0)
MCH: 30.9 pg (ref 26.0–34.0)
MCHC: 32.6 g/dL (ref 30.0–36.0)
MCV: 94.8 fL (ref 78.0–100.0)
Platelets: 289 10*3/uL (ref 150–400)
RBC: 3.63 MIL/uL — ABNORMAL LOW (ref 3.87–5.11)
RDW: 13.4 % (ref 11.5–15.5)
WBC: 12.6 10*3/uL — ABNORMAL HIGH (ref 4.0–10.5)

## 2018-02-19 LAB — BASIC METABOLIC PANEL
Anion gap: 9 (ref 5–15)
BUN: 20 mg/dL (ref 6–20)
CHLORIDE: 105 mmol/L (ref 101–111)
CO2: 22 mmol/L (ref 22–32)
CREATININE: 1.26 mg/dL — AB (ref 0.44–1.00)
Calcium: 8.6 mg/dL — ABNORMAL LOW (ref 8.9–10.3)
GFR calc Af Amer: 56 mL/min — ABNORMAL LOW (ref >60)
GFR calc non Af Amer: 48 mL/min — ABNORMAL LOW (ref >60)
Glucose, Bld: 169 mg/dL — ABNORMAL HIGH (ref 65–99)
Potassium: 4.9 mmol/L (ref 3.5–5.1)
Sodium: 136 mmol/L (ref 135–145)

## 2018-02-19 MED ORDER — ACETAMINOPHEN 500 MG PO TABS
1000.0000 mg | ORAL_TABLET | Freq: Four times a day (QID) | ORAL | Status: DC
  Administered 2018-02-19 – 2018-02-20 (×2): 1000 mg via ORAL
  Filled 2018-02-19 (×2): qty 2

## 2018-02-19 MED ORDER — VANCOMYCIN HCL IN DEXTROSE 750-5 MG/150ML-% IV SOLN
750.0000 mg | INTRAVENOUS | Status: DC
  Administered 2018-02-20 – 2018-02-21 (×2): 750 mg via INTRAVENOUS
  Filled 2018-02-19 (×2): qty 150

## 2018-02-19 MED ORDER — BISACODYL 10 MG RE SUPP
10.0000 mg | Freq: Once | RECTAL | Status: AC
  Administered 2018-02-19: 10 mg via RECTAL
  Filled 2018-02-19: qty 1

## 2018-02-19 NOTE — Progress Notes (Signed)
Pharmacy Antibiotic Note  Sarah Watson is a 52 y.o. 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 Patient received a dose of Vancomycin and Gentamicin pre-op. Pharmacy consulted for Vancomycin and Gentamicin dosing post-op for lower abdominal abscess, chronic infection of kidney.  Today, 02/19/2018: Afebrile WBC elevated SCr stable at ~ 1.26, CrCl ~ 50 ml/min  Plan:  Gentamicin 300mg  (5 mg/kg) IV x 1 on 4/9 at 1200.   Future gentamicin doses will be based on levels.  Gent level at midnight 4/10.  Vancomycin 750 mg IV q24h.  Measure Vanc peak, trough at steady state.  Goal AUC 400-500.  Daily SCr  Follow up renal fxn, culture results, and clinical course.    Height: 5\' 3"  (160 cm) Weight: 160 lb 15 oz (73 kg) IBW/kg (Calculated) : 52.4  Temp (24hrs), Avg:97.9 F (36.6 C), Min:97.6 F (36.4 C), Max:98.3 F (36.8 C)  Recent Labs  Lab 02/14/18 1014 02/18/18 1931 02/19/18 0548  WBC 7.1  --  12.6*  CREATININE 1.19* 1.29* 1.26*    Estimated Creatinine Clearance: 50.5 mL/min (A) (by C-G formula based on SCr of 1.26 mg/dL (H)).    Allergies  Allergen Reactions  . Penicillins Anaphylaxis and Other (See Comments)    Childhood Has patient had a PCN reaction causing immediate rash, facial/tongue/throat swelling, SOB or lightheadedness with hypotension: Yes Has patient had a PCN reaction causing severe rash involving mucus membranes or skin necrosis: No Has patient had a PCN reaction that required hospitalization: Yes Has patient had a PCN reaction occurring within the last 10 years: No If all of the above answers are "NO", then may proceed with Cephalosporin use.     Antimicrobials this admission: 4/8 >> Vancomycin >> 4/8 >> Gentamicin >>  Dose adjustments this admission:   4/10 0000 Gent level = ___  Microbiology results: 4/8 wound culture: ngtd  Thank you for allowing pharmacy to be a part of this patient's care.  Gretta Arab PharmD, BCPS Pager [phone] 02/19/2018 1:07 PM

## 2018-02-19 NOTE — Progress Notes (Signed)
Nutrition Brief Note  Patient identified on the Malnutrition Screening Tool (MST) Report  Patient s/p nephrectomy and I&D of abdominal abscess. Per MST screen, pt reports weight loss but presents with weight gain.   Wt Readings from Last 15 Encounters:  02/19/18 160 lb 15 oz (73 kg)  02/14/18 152 lb 4 oz (69.1 kg)  12/21/17 146 lb (66.2 kg)  12/12/17 146 lb (66.2 kg)  11/20/17 149 lb 3.2 oz (67.7 kg)  04/18/16 112 lb (50.8 kg)  04/04/16 107 lb 6.4 oz (48.7 kg)  03/31/16 106 lb (48.1 kg)  02/15/16 111 lb 9.6 oz (50.6 kg)  02/11/16 112 lb 9.6 oz (51.1 kg)  05/27/15 123 lb (55.8 kg)  04/27/15 124 lb 8 oz (56.5 kg)  07/16/14 120 lb 12.8 oz (54.8 kg)  04/21/14 125 lb (56.7 kg)  03/24/14 129 lb (58.5 kg)    Body mass index is 28.51 kg/m. Patient meets criteria for overweight based on current BMI.   Current diet order is NPO/CLD. Labs and medications reviewed.   No nutrition interventions warranted at this time. If nutrition issues arise, please consult RD.   Clayton Bibles, MS, RD, [place] Dietitian Pager: [phone] After Hours Pager: [phone]

## 2018-02-19 NOTE — Progress Notes (Addendum)
Patient ID: Sarah Watson, female   DOB: 06-25-1966, 52 y.o.   MRN: [id number]  1 Day Post-Op Subjective: Pt doing well.  Pain has been well controlled overnight with PCA.  Mild nausea.  Objective: Vital signs in last 24 hours: Temp:  [97.6 F (36.4 C)-98.3 F (36.8 C)] 97.8 F (36.6 C) (04/09 0355) Pulse Rate:  [51-78] 53 (04/09 0355) Resp:  [9-18] 12 (04/09 0406) BP: (121-144)/(72-85) 126/72 (04/09 0355) SpO2:  [100 %] 100 % (04/09 0406) Weight:  [69.1 kg (152 lb 4 oz)-73 kg (160 lb 15 oz)] 73 kg (160 lb 15 oz) (04/09 0409)  Intake/Output from previous day: 04/08 0701 - 04/09 0700 In: 6468.8 [P.O.:60; I.V.:5193.8; Blood:315; IV Piggyback:600] Out: 1225 [Urine:525; Drains:100; Blood:600] Intake/Output this shift: No intake/output data recorded.  Physical Exam:  General: Alert and oriented CV: RRR Lungs: Clear Abdomen: Soft, ND Incisions: Dressings intact. Ext: NT, No erythema  Lab Results: Recent Labs    02/18/18 1741 02/18/18 1931 02/19/18 0548  HGB 10.5* 11.8* 11.2*  HCT 31.0* 35.8* 34.4*   BMET Recent Labs    02/18/18 1931 02/19/18 0548  NA 137 136  K 4.4 4.9  CL 108 105  CO2 19* 22  GLUCOSE 190* 169*  BUN 20 20  CREATININE 1.29* 1.26*  CALCIUM 8.3* 8.6*     Studies/Results: Stone cultures pending  Assessment/Plan: XGP kidney POD # 1 s/p nephrectomy and I & D of lower abdominal abscess - Ambulate, IS , DVT prophylaxis - Pain control with PCA, ketorolac - Continue Vancomycin and gentamicin for chronic infection of kidney (cultures pending) - Continue wet to dry dressing changes to lower abdominal wound   LOS: 1 day   Sarah Watson,Sarah Watson 02/19/2018, 7:54 AM

## 2018-02-19 NOTE — Care Management Note (Signed)
Case Management Note  Patient Details  Name: Sarah Watson MRN: [id number] Date of Birth: 1966-11-05  Subjective/Objective: 52 y/o f admitted w/Chronic pyelonephritis. From home w/spouse. CM referral for [facility] wound care-informed patient that insurance would pay for [facility] to continue with instruction for wound care on intermittent basis, not BID-patient/spouse will learn to do wound care-Nsg informed to document return demonstration. Continue to monitor progress. If there is a deficit in learning then [facility] can make intermittent visis to home to reinforce teaching.                   Action/Plan:d/c plan home.   Expected Discharge Date:                  Expected Discharge Plan:  Home/Self Care  In-House Referral:     Discharge planning Services  CM Consult  Post Acute Care Choice:    Choice offered to:     DME Arranged:    DME Agency:     HH Arranged:    HH Agency:     Status of Service:  In process, will continue to follow  If discussed at Long Length of Stay Meetings, dates discussed:    Additional Comments:  Dessa Phi, RN 02/19/2018, 3:08 PM

## 2018-02-20 LAB — BASIC METABOLIC PANEL
ANION GAP: 10 (ref 5–15)
BUN: 16 mg/dL (ref 6–20)
CALCIUM: 8.8 mg/dL — AB (ref 8.9–10.3)
CO2: 23 mmol/L (ref 22–32)
Chloride: 108 mmol/L (ref 101–111)
Creatinine, Ser: 1.29 mg/dL — ABNORMAL HIGH (ref 0.44–1.00)
GFR calc Af Amer: 55 mL/min — ABNORMAL LOW (ref >60)
GFR, EST NON AFRICAN AMERICAN: 47 mL/min — AB (ref >60)
GLUCOSE: 98 mg/dL (ref 65–99)
Potassium: 4.6 mmol/L (ref 3.5–5.1)
Sodium: 141 mmol/L (ref 135–145)

## 2018-02-20 LAB — GENTAMICIN LEVEL, RANDOM: Gentamicin Rm: 8.8 ug/mL

## 2018-02-20 MED ORDER — GENTAMICIN SULFATE 40 MG/ML IJ SOLN
5.0000 mg/kg | INTRAVENOUS | Status: DC
  Administered 2018-02-21: 300 mg via INTRAVENOUS
  Filled 2018-02-20: qty 7.5

## 2018-02-20 MED ORDER — ACETAMINOPHEN 325 MG PO TABS
650.0000 mg | ORAL_TABLET | Freq: Four times a day (QID) | ORAL | Status: DC
  Administered 2018-02-20 – 2018-02-21 (×5): 650 mg via ORAL
  Filled 2018-02-20 (×5): qty 2

## 2018-02-20 MED ORDER — BISACODYL 10 MG RE SUPP
10.0000 mg | Freq: Once | RECTAL | Status: AC
  Administered 2018-02-20: 10 mg via RECTAL
  Filled 2018-02-20: qty 1

## 2018-02-20 NOTE — Progress Notes (Signed)
Pt can not afford out of pocket cost of HHRN. Pt and family members will need teaching in the AM about wet to dry dressing changes.

## 2018-02-20 NOTE — Progress Notes (Signed)
Patient ID: Ruben Pyka, female   DOB: [birth year], 52 y.o.   MRN: [id number]  2 Days Post-Op Subjective: Pt doing well.  Pain controlled with minimal po pain medication.  Ambulating well.  Tolerating full liquids.  No flatus or BM yet.  Objective: Vital signs in last 24 hours: Temp:  [97.9 F (36.6 C)-98.3 F (36.8 C)] 97.9 F (36.6 C) (04/10 0531) Pulse Rate:  [58-93] 69 (04/10 0531) Resp:  [13-19] 16 (04/10 0531) BP: (121-149)/(66-70) 129/66 (04/10 0531) SpO2:  [97 %-100 %] 100 % (04/10 0531)  Intake/Output from previous day: 04/09 0701 - 04/10 0700 In: 1828.8 [P.O.:240; I.V.:1488.8; IV Piggyback:100] Out: 2735 [Urine:2675; Drains:60] Intake/Output this shift: No intake/output data recorded.  Physical Exam:  General: Alert and oriented CV: RRR Lungs: Clear Abdomen: Soft, ND, NT Incisions: C/D/I, wound clean and packed with granulation tissue beginning Ext: NT, No erythema  Lab Results: Recent Labs    02/18/18 1741 02/18/18 1931 02/19/18 0548  HGB 10.5* 11.8* 11.2*  HCT 31.0* 35.8* 34.4*   BMET Recent Labs    02/19/18 0548 02/20/18 0523  NA 136 141  K 4.9 4.6  CL 105 108  CO2 22 23  GLUCOSE 169* 98  BUN 20 16  CREATININE 1.26* 1.29*  CALCIUM 8.6* 8.8*     Studies/Results: Cultures still pending from OR  Assessment/Plan: XGP kidney POD # 1 s/p nephrectomy and I & D of lower abdominal abscess - Ambulate, IS , DVT prophylaxis - D/C PCA and continue oral pain meds - Advance diet - Continue Vancomycin and gentamicin for chronic infection of kidney (cultures pending) - Continue wet to dry dressings to lower abdominal wound - Possible d/c tomorrow if continues to progress and wound care teaching/HHRN set up     LOS: 2 days   Sheily Lineman,LES 02/20/2018, 7:30 AM

## 2018-02-20 NOTE — Progress Notes (Signed)
Dilaudid PCA wasted with Luberta Mutter, RN. 18 mg wasted into sink.

## 2018-02-20 NOTE — Anesthesia Postprocedure Evaluation (Signed)
Anesthesia Post Note  Patient: Sarah Watson  Procedure(s) Performed: ROBOT ASSITED LAPAROSCOPIC RIGHT NEPHRECTOMY AND INCISION AND DRAINAGE OF SUPERFICIAL PELVIC ABCESS AND REMOVAL OF CALCULOUS (Right )     Patient location during evaluation: PACU Anesthesia Type: General Level of consciousness: awake and alert Pain management: pain level controlled Vital Signs Assessment: post-procedure vital signs reviewed and stable Respiratory status: spontaneous breathing, nonlabored ventilation, respiratory function stable and patient connected to nasal cannula oxygen Cardiovascular status: blood pressure returned to baseline and stable Postop Assessment: no apparent nausea or vomiting Anesthetic complications: no    Last Vitals:  Vitals:   02/20/18 0400 02/20/18 0531  BP:  129/66  Pulse:  69  Resp: 15 16  Temp:  36.6 C  SpO2: 100% 100%    Last Pain:  Vitals:   02/20/18 0800  TempSrc:   PainSc: 0-No pain                 Tristen Luce DAVID

## 2018-02-20 NOTE — Progress Notes (Signed)
Pharmacy Antibiotic Note  Sarah Watson is a 52 y.o. 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 Patient received a dose of Vancomycin and Gentamicin pre-op. Pharmacy consulted for Vancomycin and Gentamicin dosing post-op for lower abdominal abscess, chronic infection of kidney.   Plan: Gentamicin 300mg  dose charted at 1438 4/9, gentamicin level 0059 4/10, therefore 10hour 25min level.  Using [employer] Nomogram yields q48hr hour dosing  Will begin dosing gentamicin 300mg  iv q48hr with next dose 4/11 at 1400.  Height: 5\' 3"  (160 cm) Weight: 160 lb 15 oz (73 kg) IBW/kg (Calculated) : 52.4  Temp (24hrs), Avg:98.2 F (36.8 C), Min:98 F (36.7 C), Max:98.3 F (36.8 C)  Recent Labs  Lab 02/14/18 1014 02/18/18 1931 02/19/18 0548 02/20/18 0059  WBC 7.1  --  12.6*  --   CREATININE 1.19* 1.29* 1.26*  --   GENTRANDOM  --   --   --  8.8    Estimated Creatinine Clearance: 50.5 mL/min (A) (by C-G formula based on SCr of 1.26 mg/dL (H)).    Allergies  Allergen Reactions  . Penicillins Anaphylaxis and Other (See Comments)    Childhood Has patient had a PCN reaction causing immediate rash, facial/tongue/throat swelling, SOB or lightheadedness with hypotension: Yes Has patient had a PCN reaction causing severe rash involving mucus membranes or skin necrosis: No Has patient had a PCN reaction that required hospitalization: Yes Has patient had a PCN reaction occurring within the last 10 years: No If all of the above answers are "NO", then may proceed with Cephalosporin use.     Antimicrobials this admission: 4/8 >> Vancomycin >> 4/8 >> Gentamicin >>    Dose adjustments this admission: 4/10 0100 Gent level 8.8--1438 dose--0059 level = 10hr 110min level.   Using [employer] Nomogram  yields q48hr hour dosing   Microbiology results: 4/8 wound culture: ngtd  Thank you for allowing pharmacy to be a part of this patient's care.  Nani Skillern Crowford 02/20/2018 4:02 AM

## 2018-02-21 LAB — BASIC METABOLIC PANEL
ANION GAP: 9 (ref 5–15)
BUN: 13 mg/dL (ref 6–20)
CHLORIDE: 108 mmol/L (ref 101–111)
CO2: 24 mmol/L (ref 22–32)
Calcium: 8.5 mg/dL — ABNORMAL LOW (ref 8.9–10.3)
Creatinine, Ser: 1.28 mg/dL — ABNORMAL HIGH (ref 0.44–1.00)
GFR calc non Af Amer: 48 mL/min — ABNORMAL LOW (ref >60)
GFR, EST AFRICAN AMERICAN: 55 mL/min — AB (ref >60)
Glucose, Bld: 99 mg/dL (ref 65–99)
Potassium: 4.5 mmol/L (ref 3.5–5.1)
Sodium: 141 mmol/L (ref 135–145)

## 2018-02-21 MED ORDER — OXYCODONE-ACETAMINOPHEN 5-325 MG PO TABS: 1.0000 | ORAL_TABLET | ORAL | 0 refills | Status: AC | PRN

## 2018-02-21 MED ORDER — DOXYCYCLINE HYCLATE 50 MG PO CAPS: 100.0000 mg | ORAL_CAPSULE | Freq: Two times a day (BID) | ORAL | 0 refills | Status: AC

## 2018-02-21 NOTE — Discharge Summary (Signed)
. [facility] Discharge Summary  Admit date: 02/18/2018  Discharge date and time: 02/21/18   Discharge to: Home  Discharge Service: Urology  Discharge Attending Physician:  Dr. Alinda Money  Discharge  Diagnoses: Right xanthogranulomatous pyelonephritis  Secondary Diagnosis: Active Problems:   Chronic pyonephrosis   OR Procedures: Procedure(s): ROBOT ASSITED LAPAROSCOPIC RIGHT NEPHRECTOMY AND INCISION AND DRAINAGE OF SUPERFICIAL PELVIC ABCESS AND REMOVAL OF CALCULOUS 02/18/2018   Ancillary Procedures: None   Discharge Day Services: The patient was seen and examined by the Urology team both in the morning and immediately prior to discharge.  Vital signs and laboratory values were stable and within normal limits.  The physical exam was benign and unchanged and all surgical wounds were examined.  Discharge instructions were explained and all questions answered.  Subjective  No acute events overnight. Pain Controlled. No fever or chills.  Objective Patient Vitals for the past 8 hrs:  BP Temp Temp src Pulse Resp SpO2  02/21/18 0452 116/65 98.5 F (36.9 C) Oral 65 16 99 %   No intake/output data recorded.  General Appearance:        No acute distress Lungs:                       Normal work of breathing on room air Heart:                                Regular rate and rhythm Abdomen:                         Soft, non-tender, non-distended. Right subcostal incision c/d/i with staples intact. Port insicions staples, and also well-appearing. 4cm lower midline abdominal wound packed with Kerlix and covered with ABDs.  Extremities:                      Warm and well perfused   Hospital Course:  The patient underwent a robotic, converted to open, right nephrectomy with lower midline abscess incision and drainage on 02/18/2018. She received 1u pRBC intra-op for blood loss of 600cc. The patient tolerated the procedure well, was extubated in the OR, and afterwards was taken to the PACU for  routine post-surgical care. When stable the patient was transferred to the floor.   The patient did well postoperatively.  The patient's diet was slowly advanced and at the time of discharge was tolerating a regular diet.  The patient was discharged home 3 Days Post-Op, at which point was tolerating a regular solid diet, was able to void spontaneously, have adequate pain control with P.O. pain medication, and could ambulate without difficulty. She was instructed how to care for her midline abdominal wound, which will continue to be managed by WTD dressings. The patient will follow up with [place] for post op check and staple removal in 1-2 weeks.   Condition at Discharge: Improved  Discharge Medications:  Allergies as of 02/21/2018      Reactions   Penicillins Anaphylaxis, Other (See Comments)   Childhood Has patient had a PCN reaction causing immediate rash, facial/tongue/throat swelling, SOB or lightheadedness with hypotension: Yes Has patient had a PCN reaction causing severe rash involving mucus membranes or skin necrosis: No Has patient had a PCN reaction that required hospitalization: Yes Has patient had a PCN reaction occurring within the last 10 years: No If all of the above answers are "NO",  then may proceed with Cephalosporin use.      Medication List    STOP taking these medications   HYDROcodone-acetaminophen 10-325 MG tablet Commonly known as:  NORCO Replaced by:  HYDROcodone-acetaminophen 5-325 MG tablet   phenazopyridine 200 MG tablet Commonly known as:  PYRIDIUM     TAKE these medications   doxycycline 50 MG capsule Commonly known as:  VIBRAMYCIN Take 2 capsules (100 mg total) by mouth 2 (two) times daily.   HYDROcodone-acetaminophen 5-325 MG tablet Commonly known as:  NORCO Take 1-2 tablets by mouth every 6 (six) hours as needed for moderate pain or severe pain. Replaces:  HYDROcodone-acetaminophen 10-325 MG tablet   oxyCODONE-acetaminophen 5-325 MG tablet Commonly  known as:  PERCOCET Take 1 tablet by mouth every 4 (four) hours as needed for severe pain.

## 2018-02-21 NOTE — Progress Notes (Signed)
Patient ID: Sarah Watson, female   DOB: December 14, 1965, 52 y.o.   MRN: [id number]  3 Days Post-Op Subjective: She has had a BM and continues to feel well.  Pain is easily controlled with oral pain medication.  Ambulating well.  Objective: Vital signs in last 24 hours: Temp:  [98.5 F (36.9 C)-98.7 F (37.1 C)] 98.5 F (36.9 C) (04/11 0452) Pulse Rate:  [65-76] 65 (04/11 0452) Resp:  [16-18] 16 (04/11 0452) BP: (115-119)/(65-72) 116/65 (04/11 0452) SpO2:  [98 %-100 %] 99 % (04/11 0452)  Intake/Output from previous day: 04/10 0701 - 04/11 0700 In: 2740 [P.O.:865; I.V.:1725; IV Piggyback:150] Out: 960 [Urine:900; Drains:60] Intake/Output this shift: No intake/output data recorded.  Physical Exam:  General: Alert and oriented CV: RRR Lungs: Clear Abdomen: Soft, ND, Positive BS Incisions: C/D/I (dressings all removed), lower abdominal wound is granulating (repacked) Ext: NT, No erythema  Lab Results: Recent Labs    02/18/18 1741 02/18/18 1931 02/19/18 0548  HGB 10.5* 11.8* 11.2*  HCT 31.0* 35.8* 34.4*   BMET Recent Labs    02/20/18 0523 02/21/18 0528  NA 141 141  K 4.6 4.5  CL 108 108  CO2 23 24  GLUCOSE 98 99  BUN 16 13  CREATININE 1.29* 1.28*  CALCIUM 8.8* 8.5*     Studies/Results: Culture from OR still pending  Assessment/Plan: POD # 3 s/p right nephrectomy for XGP kidney and I & D of lower abdominal abscess and removal of stones - SL IVF - Regular diet - Oral pain medication - Ambulate, DVT prohpylaxis - Teaching for wound care of lower abdominal wound (pt wishes to avoid HHRN due to expense) - D/C drain - Plan for d/c home later today (will continue oral doxycyline per pre-op wound/drain culture results pending final results of intraoperative culture)   LOS: 3 days   Spyros Winch,LES 02/21/2018, 7:30 AM

## 2018-02-22 LAB — TYPE AND SCREEN
ABO/RH(D): B POS
Antibody Screen: NEGATIVE
UNIT DIVISION: 0
Unit division: 0

## 2018-02-22 LAB — BPAM RBC
BLOOD PRODUCT EXPIRATION DATE: 201904252359
BLOOD PRODUCT EXPIRATION DATE: 201904252359
ISSUE DATE / TIME: 201904081636
ISSUE DATE / TIME: 201904081636
UNIT TYPE AND RH: 7300
Unit Type and Rh: 7300

## 2018-02-23 LAB — AEROBIC/ANAEROBIC CULTURE W GRAM STAIN (SURGICAL/DEEP WOUND)

## 2018-02-23 LAB — AEROBIC/ANAEROBIC CULTURE (SURGICAL/DEEP WOUND)

## 2018-09-17 ENCOUNTER — Other Ambulatory Visit: Payer: Self-pay | Admitting: Gynecology

## 2018-09-17 DIAGNOSIS — Z1231 Encounter for screening mammogram for malignant neoplasm of breast: Secondary | ICD-10-CM

## 2018-09-25 ENCOUNTER — Ambulatory Visit (HOSPITAL_BASED_OUTPATIENT_CLINIC_OR_DEPARTMENT_OTHER): Payer: BC Managed Care – PPO

## 2018-09-25 ENCOUNTER — Ambulatory Visit (HOSPITAL_BASED_OUTPATIENT_CLINIC_OR_DEPARTMENT_OTHER)
Admission: RE | Admit: 2018-09-25 | Discharge: 2018-09-25 | Disposition: A | Discharge status: Home / Self Care | Payer: BC Managed Care – PPO | Source: Ambulatory Visit | Attending: Gynecology | Admitting: Gynecology

## 2018-09-25 DIAGNOSIS — Z1231 Encounter for screening mammogram for malignant neoplasm of breast: Secondary | ICD-10-CM | POA: Insufficient documentation

## 2019-01-13 IMAGING — MG DIGITAL SCREENING BILATERAL MAMMOGRAM WITH TOMO AND CAD
8 series · 9 of 24 positions shown · non-contrast
Comparison: Previous exam(s).

CLINICAL DATA: Screening.

EXAM:
DIGITAL SCREENING BILATERAL MAMMOGRAM WITH TOMO AND CAD

[L CC synth-2D]
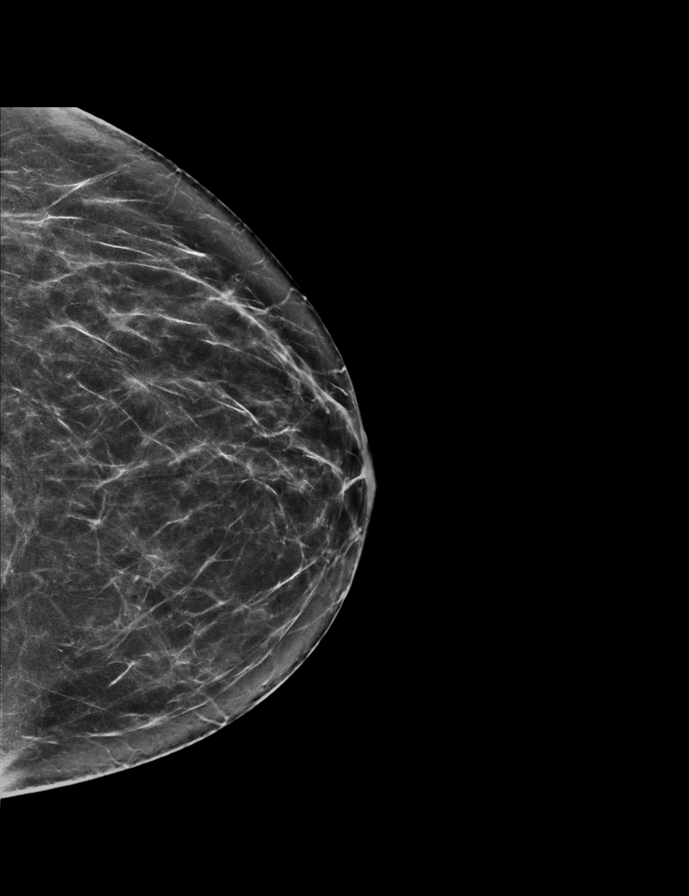

[L MLO synth-2D]
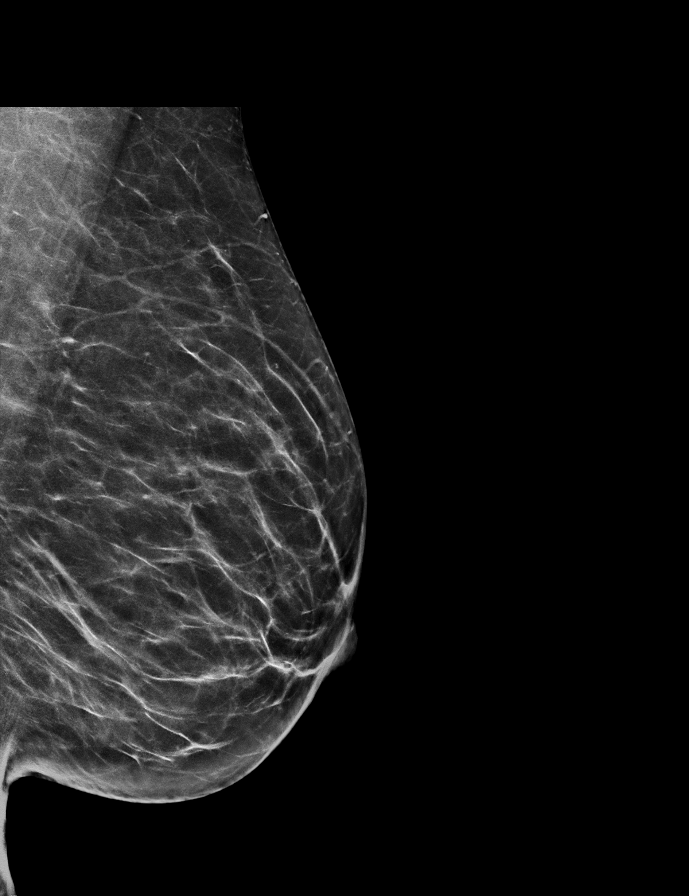

[R CC synth-2D]
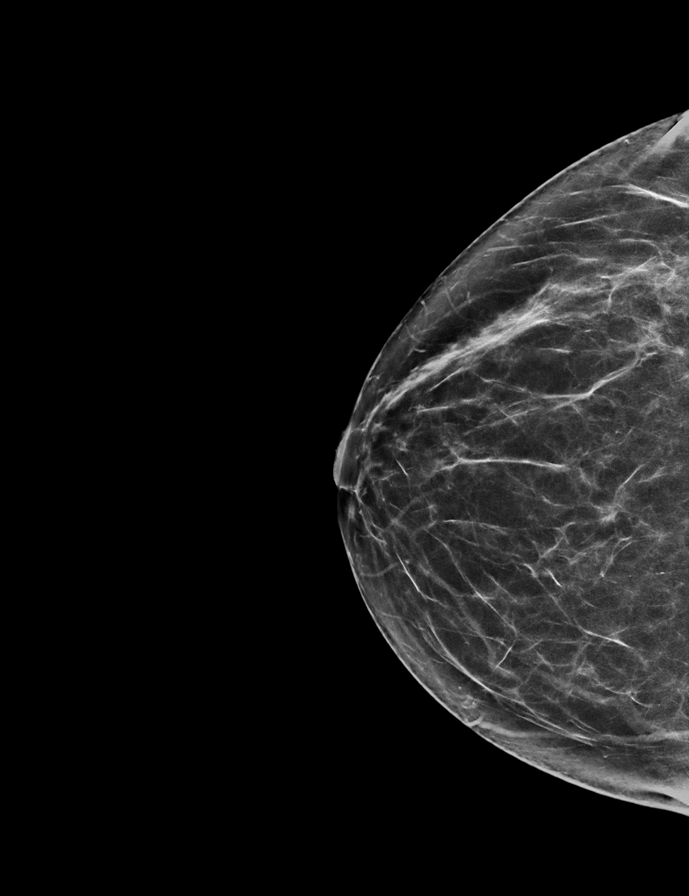

[R MLO synth-2D]
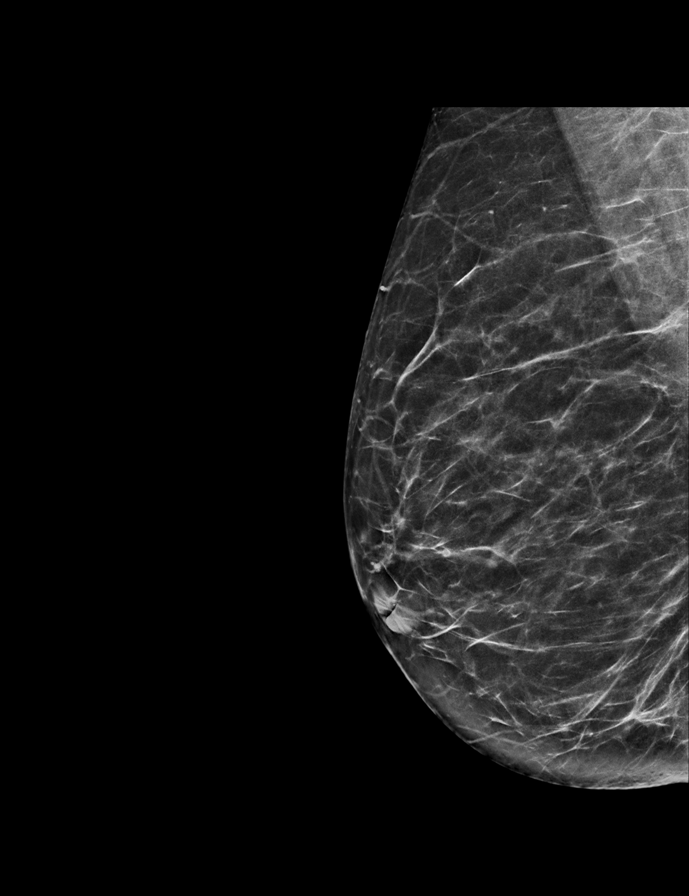

[L MLO tomo · 2 of 65 frames shown]
[frame 21/65]
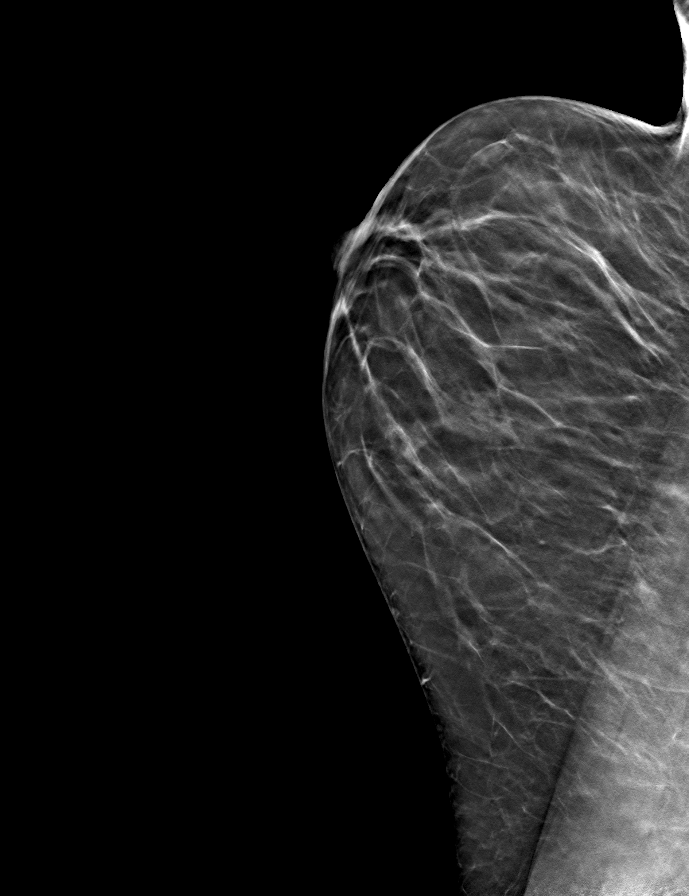
[frame 33/65]
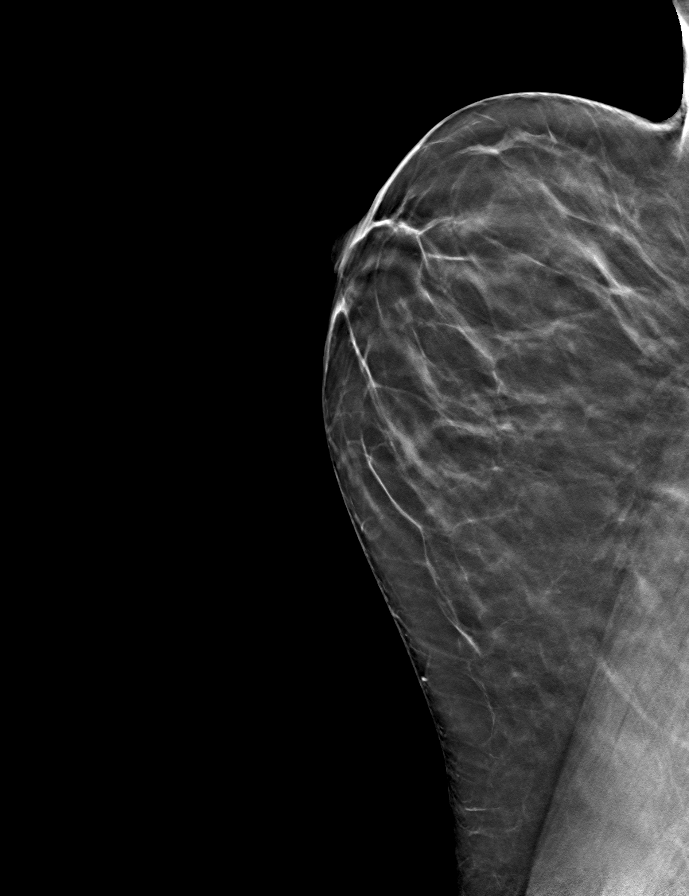

[R CC tomo · tomo slice 33/65.0]
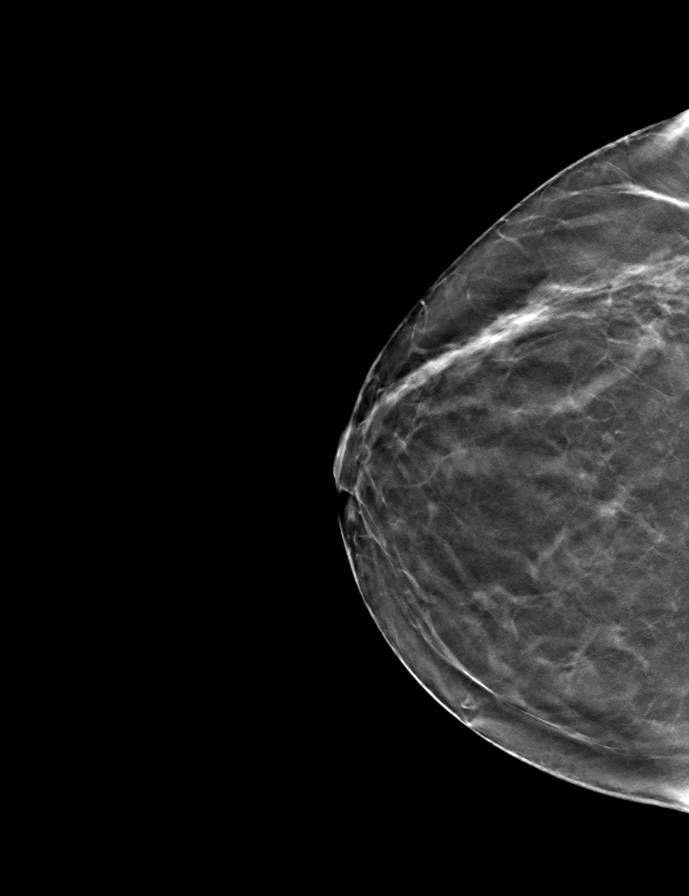

[L CC tomo · tomo slice 32/63.0]
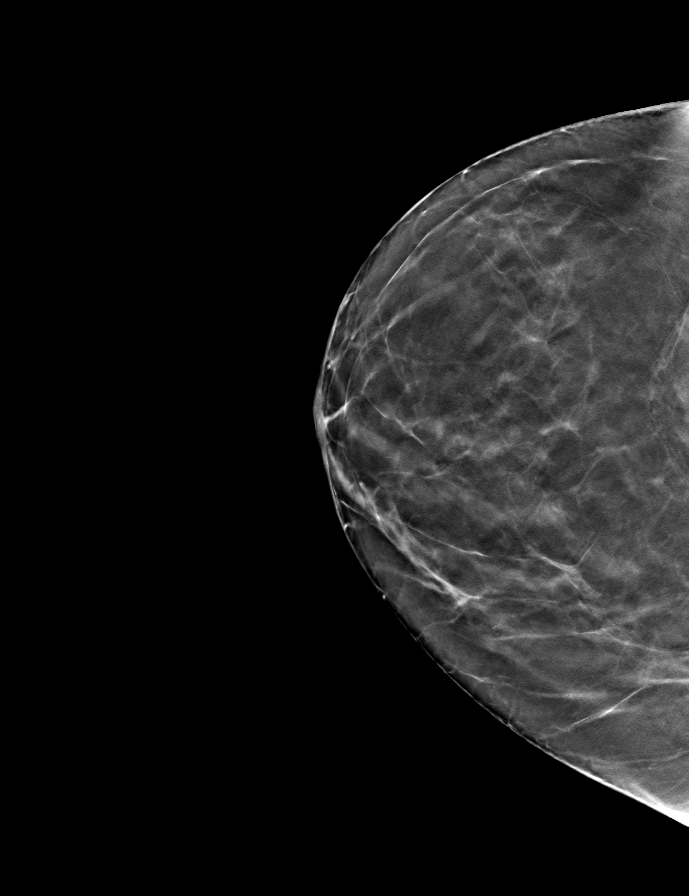

[R MLO tomo · tomo slice 33/65.0]
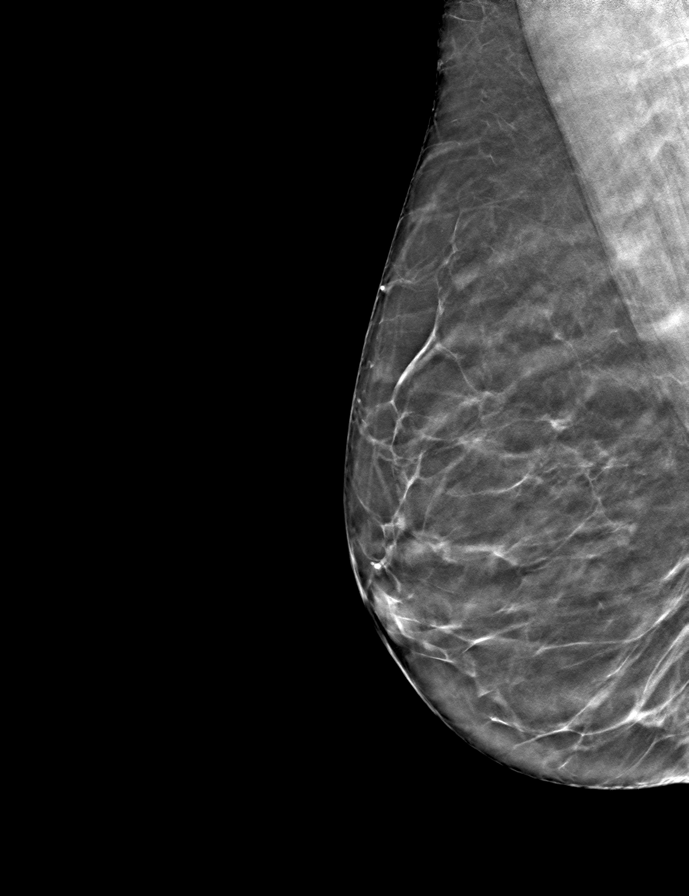

[9 of 24 positions shown; findings below may reference images not displayed]

ACR Breast Density Category b: There are scattered areas of
fibroglandular density.
FINDINGS: There are no findings suspicious for malignancy. Images were
processed with CAD.
IMPRESSION: No mammographic evidence of malignancy. A result letter of this
screening mammogram will be mailed directly to the patient.

RECOMMENDATION:
Screening mammogram in one year. (Code:[id number])

BI-RADS CATEGORY  1: Negative.

## 2019-08-06 ENCOUNTER — Encounter: Payer: Self-pay | Admitting: Gynecology

## 2022-09-13 ENCOUNTER — Other Ambulatory Visit (HOSPITAL_COMMUNITY): Payer: Self-pay | Admitting: Family Medicine

## 2022-09-13 DIAGNOSIS — Z1231 Encounter for screening mammogram for malignant neoplasm of breast: Secondary | ICD-10-CM

## 2022-11-28 ENCOUNTER — Encounter: Payer: Self-pay | Admitting: Internal Medicine

## 2022-12-04 ENCOUNTER — Encounter: Payer: Self-pay | Admitting: Internal Medicine

## 2022-12-04 ENCOUNTER — Ambulatory Visit (HOSPITAL_COMMUNITY)
Admission: RE | Admit: 2022-12-04 | Discharge: 2022-12-04 | Disposition: A | Discharge status: Home / Self Care | Payer: BC Managed Care – PPO | Source: Ambulatory Visit | Attending: Family Medicine | Admitting: Family Medicine

## 2022-12-04 DIAGNOSIS — Z1231 Encounter for screening mammogram for malignant neoplasm of breast: Secondary | ICD-10-CM | POA: Insufficient documentation

## 2023-11-12 ENCOUNTER — Encounter: Payer: Self-pay | Admitting: Internal Medicine

## 2024-06-19 ENCOUNTER — Encounter (INDEPENDENT_AMBULATORY_CARE_PROVIDER_SITE_OTHER): Payer: Self-pay | Admitting: *Deleted

## 2024-10-16 ENCOUNTER — Other Ambulatory Visit (HOSPITAL_COMMUNITY): Payer: Self-pay | Admitting: Family Medicine

## 2024-10-16 DIAGNOSIS — Z1231 Encounter for screening mammogram for malignant neoplasm of breast: Secondary | ICD-10-CM

## 2024-10-29 ENCOUNTER — Encounter: Payer: Self-pay | Admitting: Internal Medicine

## 2024-10-29 ENCOUNTER — Encounter (HOSPITAL_COMMUNITY): Payer: Self-pay

## 2024-10-29 ENCOUNTER — Inpatient Hospital Stay (HOSPITAL_COMMUNITY): Admission: RE | Admit: 2024-10-29 | Discharge: 2024-10-29 | Attending: Family Medicine | Admitting: Family Medicine

## 2024-10-29 DIAGNOSIS — Z1231 Encounter for screening mammogram for malignant neoplasm of breast: Secondary | ICD-10-CM | POA: Insufficient documentation
